# Patient Record
Sex: Male | Born: 1990
Health system: Southern US, Community
[De-identification: ages and names within clinical notes are randomized; demographics above are authoritative.]

## PROBLEM LIST (undated history)

## (undated) DIAGNOSIS — I10 Essential (primary) hypertension: Secondary | ICD-10-CM

## (undated) DIAGNOSIS — D649 Anemia, unspecified: Secondary | ICD-10-CM

## (undated) DIAGNOSIS — J351 Hypertrophy of tonsils: Secondary | ICD-10-CM

## (undated) HISTORY — PX: APPENDECTOMY: SHX54

## (undated) HISTORY — PX: LEG SURGERY: SHX1003

## (undated) HISTORY — DX: Anemia, unspecified: D64.9

## (undated) HISTORY — DX: Hypertrophy of tonsils: J35.1

---
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
## 2009-02-16 ENCOUNTER — Ambulatory Visit: Payer: Self-pay | Admitting: Family Medicine

## 2009-02-16 DIAGNOSIS — I10 Essential (primary) hypertension: Secondary | ICD-10-CM | POA: Insufficient documentation

## 2009-02-16 DIAGNOSIS — R599 Enlarged lymph nodes, unspecified: Secondary | ICD-10-CM | POA: Insufficient documentation

## 2009-05-24 ENCOUNTER — Emergency Department (HOSPITAL_COMMUNITY): Admission: EM | Admit: 2009-05-24 | Discharge: 2009-05-24 | Payer: Self-pay | Admitting: Emergency Medicine

## 2009-07-20 ENCOUNTER — Emergency Department (HOSPITAL_COMMUNITY): Admission: EM | Admit: 2009-07-20 | Discharge: 2009-07-20 | Payer: Self-pay | Admitting: Emergency Medicine

## 2010-04-10 ENCOUNTER — Emergency Department (HOSPITAL_COMMUNITY): Admission: EM | Admit: 2010-04-10 | Discharge: 2010-04-10 | Payer: Self-pay | Admitting: Emergency Medicine

## 2010-07-22 NOTE — Progress Notes (Signed)
Summary: [place] request  Phone Note Call from Patient Call back at [phone]   Caller: Mom, Zella Ball Summary of Call: Mom states son was seen 2 weeks ago for a rash and was given steriods for it.  Rash got better and went away, but now rash is back and it is leaking fluid.  Mom wants to know if he should be seen or if we should call in steriods again. Initial call taken by: Haydee Salter,  February 13, 2007 2:17 PM  Follow-up for Phone Call        line busy x2 line busy x 2 .....................................................................Golden Circle RN  February 13, 2007 2:43 PM  Follow-up by: Golden Circle RN,  February 13, 2007 2:24 PM  Additional Follow-up for Phone Call Additional follow up Details #1::        appt scheduled with pcp in am. mother states the rash is now cracked & bleeding Additional Follow-up by: Golden Circle RN,  February 13, 2007 3:07 PM

## 2010-07-22 NOTE — Miscellaneous (Signed)
Summary: flu shot  Clinical Lists Changes  Problems: Added new problem of VACCINE AGAINST INFLUENZA (ICD-V04.81) Orders: Added new Service order of Flu Vaccine 43yrs + [phone]) - Signed Added new Service order of Admin 1st Vaccine (78295) - Signed Added new Test order of Est Level 1- [facility] [phone]) - Signed Observations: Added new observation of FLU VAX#1VIS: 12/17/04 version given April 06, 2007. (04/06/2007 9:22) Added new observation of FLU VAXLOT: [id number] (04/06/2007 9:22) Added new observation of FLU VAX EXP: 12/18/2007 (04/06/2007 9:22) Added new observation of FLU VAXBY: SALLY CALDWELL RN (04/06/2007 9:22) Added new observation of FLU VAXRTE: IM (04/06/2007 9:22) Added new observation of FLU VAX DSE: 0.5 ml (04/06/2007 9:22) Added new observation of FLU VAXMFR: Sanofi Pasteur (04/06/2007 9:22) Added new observation of FLU VAX SITE: left deltoid (04/06/2007 9:22) Added new observation of FLU VAX: Fluvax 3+ (04/06/2007 9:22) Added new observation of TEMPERATURE: 98.5 deg F (04/06/2007 9:22)       Vital Signs:  Patient Profile:   20 Years Old Male Temp:     98.5 degrees F                Influenza Vaccine    Vaccine Type: Fluvax 3+    Site: left deltoid    Mfr: Sanofi Pasteur    Dose: 0.5 ml    Route: IM    Given by: Golden Circle RN    Exp. Date: 12/18/2007    Lot #: [id number]    VIS given: 12/17/04 version given April 06, 2007.  Flu Vaccine Consent Questions    Do you have a history of severe allergic reactions to this vaccine? no    Any prior history of allergic reactions to egg and/or gelatin? no    Do you have a sensitivity to the preservative Thimersol? no    Do you have a past history of Guillan-Barre Syndrome? no    Do you currently have an acute febrile illness? no    Have you ever had a severe reaction to latex? no    Vaccine information given and explained to patient? yes

## 2010-07-22 NOTE — Progress Notes (Signed)
  Phone Note Outgoing Call   Call placed by: AMY MARTIN RN,  April 25, 2007 10:47 AM Call placed to: Pt's mom Summary of Call: Spoke with pt's mom robin - advised her per Dr. Ludwig Clarks to have pt f/u in 2-3 days if his wrist pain was not improved.  Advised that x-rays of hand and wrist came back normal. Initial call taken by: AMY MARTIN RN,  April 25, 2007 10:48 AM

## 2010-07-22 NOTE — Progress Notes (Signed)
Summary: [place] request  Phone Note Call from Patient   Summary of Call: mom wants pt to be seen this morning at 10:30am for a swollen shut eye - has bothered him all weekend and is very red Initial call taken by: Haydee Salter,  January 29, 2007 9:02 AM

## 2010-07-22 NOTE — Progress Notes (Signed)
Summary: triage  Phone Note Call from Patient Call back at Home Phone [phone]   Reason for Call: Talk to Nurse Summary of Call: pts mom sts she thinks pt slept on his wrist wrong but pt has fever in it, he can't turn his wrist and it hurts to move his pinky, she wants to know what she should do? Initial call taken by: ERIN LEVAN,  April 24, 2007 10:14 AM  Follow-up for Phone Call        to bring him in at 11am Follow-up by: Golden Circle RN,  April 24, 2007 10:22 AM

## 2010-07-22 NOTE — Assessment & Plan Note (Signed)
Summary: poison oak   Vital Signs:  Patient Profile:   20 Years Old Male Weight:      294 pounds Temp:     98.4 degrees F Pulse rate:   79 / minute BP sitting:   128 / 70  Pt. in pain?   no  Vitals Entered By: Golden Circle RN (November 20, 2007 3:00 PM)              Is Patient Diabetic? No     PCP:  MAKEECHA Corliss Marcus MD  Chief Complaint:  poison ivy or oak on r forearm.  History of Present Illness: Pt reports that he was outside on Saturday playing with a friend. +exposure to woods. Thinks he might have come in contact with poison oak. Reports new itchy lesion on right forearm with subsequent eruption of similar area on upper abdomen. Intensely pruritic. States that he has been very sensitive to this in the past and typically gets a reaction "about once a year." Typically has required oral steriods in the past. States he is allergic to calomine lotion. No fevers or chills. Also states he has eczema and psoriasis.       Physical Exam  General:      no acute distress. Obese. Pleasan Skin:      1 x 1 cm area of papular, honey-colored lesion on right forearm. No surrounding erythema. Another 0.5 x 0.5 patch on upper abdomen. No other lesions on body. No other rash. No discharge or drainage.     Impression & Recommendations:  Problem # 1:  SKIN RASH (ICD-782.1) Assessment: Deteriorated Likely contact dermatitis given past history of poison oak. Will treat with 10 days prednisone 20 mg. Advised hydrocortisone cream to area to help with itching. Benadryl as needed. Return if not better after couse of steroids. Does not look eczematous or psoriatic given appearance and distribution. His updated medication list for this problem includes:    Zyrtec Allergy 10 Mg Tabs (Cetirizine hcl) .Marland Kitchen... 1 tab by mouth daily    Sterapred Ds 12 Day 10 Mg Tabs (Prednisone) .Marland Kitchen... Take as directed    Kenalog 0.1 % Crea (Triamcinolone acetonide) .Marland Kitchen... Apply two times a day  to affected  area    Prednisone 20 Mg Tabs (Prednisone) ..... One by mouth daily for 10 days  Orders: [facility]- Est Level  3 ([address])   Medications Added to Medication List This Visit: 1)  Prednisone 20 Mg Tabs (Prednisone) .... One by mouth daily for 10 days   Patient Instructions: 1)  Take prednisone 20 mg once a day for 10 days. 2)  Use hydrocortisone cream to cover the areas of rash. 3)  Use benadryl over the counter per bottle instructions for itching. Might make you sleepy.   Prescriptions: PREDNISONE 20 MG  TABS (PREDNISONE) one by mouth daily for 10 days  #10 x 0   Entered and Authorized by:   Myrtie Soman  MD   Signed by:   Myrtie Soman  MD on 11/20/2007   Method used:   Electronically sent to ...       [facility] [address] [phone]*       [address]       [place], [place]  [address]       Ph: [phone]       Fax: [phone]   RxID:   [id number]  ]

## 2010-07-22 NOTE — Progress Notes (Signed)
Summary: Triage  Phone Note Call from Patient Call back at Home Phone [phone]   Caller: David [name] Summary of Call: Mom wants child to be seen this afternoon with 2 brothers for poison oak. Initial call taken by: Haydee Salter,  November 20, 2007 10:48 AM  Follow-up for Phone Call        appt made for 1:30pm today. 2 sibs with same issue already have appts Follow-up by: Golden Circle RN,  November 20, 2007 10:54 AM

## 2010-07-22 NOTE — Assessment & Plan Note (Signed)
Summary: POISON IVY/EO   Vital Signs:  Patient profile:   20 year old male Height:      70 inches Weight:      229.4 pounds BMI:     33.03 Temp:     98.1 degrees F oral Pulse rate:   80 / minute BP sitting:   133 / 78  (left arm) Cuff size:   large  Vitals Entered By: Dedra Skeens CMA, (Oct 21, 2008 8:39 AM) CC: poison ivy Is Patient Diabetic? No Pain Assessment Patient in pain? no        History of Present Illness: 20 yo WM who presents for Acute Visit due to poison ivy.  Was outside in woods last Wed or Thurs, then noticed lesions starting last Friday.  Has had muliple times before and required oral steroids because it usually involves the face.  This time the face is spared.  Predominantly involves bilateral arms and less so on stomach / thighs.  No fever.    PMHx, SHx, FHx, Medications, Allergies reviewed for relevance.  See below for changes.   Habits & Providers     Tobacco Status: never  Allergies: 1)  ! Augmentin Es-600 (Amoxicillin-Pot Clavulanate)  Review of Systems      See HPI  Physical Exam  General:  Overweight-appearing, no acute distress.  Alert and oriented x 3.  Skin:  Multiple erythematous vesicles in linear distribution predominantly on bilateral arms.  Satellite lesions on lower stomach and thighs.  No involvemen of face.   Impression & Recommendations:  Problem # 1:  POISON IVY DERMATITIS (ICD-692.6) Assessment New Will give Lidex ointment for topical use to help with itching.  No need for oral steroids given lack of facial involvement.  Will also use Zyrtec at night to help with itching.  Recommended other over the counter preparations such as oatmeal baths and benzocaine spray as needed for relief as well.  Gave red flags for which to call or return.  His updated medication list for this problem includes:    Zyrtec Allergy 10 Mg Tabs (Cetirizine hcl) .Marland Kitchen... 1 tab by mouth daily    Sterapred Ds 12 Day 10 Mg Tabs (Prednisone) .Marland Kitchen... Take as  directed    Kenalog 0.1 % Crea (Triamcinolone acetonide) .Marland Kitchen... Apply two times a day  to affected area    Prednisone 20 Mg Tabs (Prednisone) ..... One by mouth daily for 10 days    Lidex 0.05 % Crea (Fluocinonide) .Marland Kitchen... Apply two times a day to affected areas as needed for up to two weeks - do not use on face or groin - disp 60 gram tube    Zyrtec Allergy 10 Mg Tabs (Cetirizine hcl) .Marland Kitchen... 1 tablet by mouth at bedtime as needed for itching Orders: [facility]- Est Level  3 ([address]109)  Complete Medication List: 1)  Zyrtec Allergy 10 Mg Tabs (Cetirizine hcl) .Marland Kitchen.. 1 tab by mouth daily 2)  Sterapred Ds 12 Day 10 Mg Tabs (Prednisone) .... Take as directed 3)  Kenalog 0.1 % Crea (Triamcinolone acetonide) .... Apply two times a day  to affected area 4)  Prednisone 20 Mg Tabs (Prednisone) .... One by mouth daily for 10 days 5)  Bactrim Ds 800-160 Mg Tabs (Sulfamethoxazole-trimethoprim) .Marland Kitchen.. 1 tab by mouth by mouth two times a day x 14 days 6)  Lidex 0.05 % Crea (Fluocinonide) .... Apply two times a day to affected areas as needed for up to two weeks - do not use on face or groin -  disp 60 gram tube 7)  Zyrtec Allergy 10 Mg Tabs (Cetirizine hcl) .Marland Kitchen.. 1 tablet by mouth at bedtime as needed for itching  Patient Instructions: 1)  Please schedule a follow-up appointment as needed.  2)  If the poison ivy spreads to your face, please call the office. 3)  Use the Lidex (Fluocinonide) cream two times a day except on face or groin area. 4)  Use over the counter Hydrocortisone on face and groin areas. 5)  You can take Zyrtec at night as needed for itching. Prescriptions: ZYRTEC ALLERGY 10 MG TABS (CETIRIZINE HCL) 1 tablet by mouth at bedtime as needed for itching  #30 x 0   Entered and Authorized by:   Drue Dun MD   Signed by:   Drue Dun MD on 10/21/2008   Method used:   Electronically to        [facility]. [phone]* (retail)       [address]       [place], [place]   [address]       Ph: [phone]       Fax: [phone]   RxID:   [id number] LIDEX 0.05 % CREA (FLUOCINONIDE) Apply two times a day to affected areas as needed for up to two weeks - do not use on face or groin - disp 60 gram tube  #1 x 0   Entered and Authorized by:   Drue Dun MD   Signed by:   Drue Dun MD on 10/21/2008   Method used:   Electronically to        [facility]. [phone]* (retail)       [address]       [place], [place]  [address]       Ph: [phone]       Fax: [phone]   RxID:   [id number]

## 2010-07-22 NOTE — Assessment & Plan Note (Signed)
Summary: LEFT WRIST PAIN   Vital Signs:  Patient Profile:   20 Years Old Male Weight:      293 pounds Pulse rate:   81 / minute BP sitting:   161 / 85  Vitals Entered By: Lillia Pauls CMA (April 24, 2007 11:20 AM)                 PCP:  Glorianne Manchester MD  Chief Complaint:  L WRIST PAIN X  1 D.  History of Present Illness: 20 yo WM c/o of acute onset L wrist  and L  4th/ 5th finger pain. Denies injury, insect bite, weight lift, fever, chills. Wrist flexion/ extension is decreased 2 to pain, as well as 4th and 5th finger flexion / extension and L wrist supination. Mild edema of L wrist / hand. NO sensory / motor deficits. No L elbow, shoulder/neck pain.  Denies previous L wrist / hand injury       Physical Exam  General:      good color, well hydrated, and obese.   Lungs:      Clear to ausc, no crackles, rhonchi or wheezing, no grunting, flaring or retractions  Heart:      RRR without murmur  Pulses:      2 + peripheral pulses Extremities:      L Wrist flexion/ extension is decreased 2 to pain, as well as 4th and 5th finger flexion / extension and L wrist supination. Mild edema of L wrist / hand. NO sensory / motor deficits. Pulses  are intact.  Neurologic:      Neurologic exam grossly intact  Skin:      intact without lesions, rashes  Psychiatric:      alert and cooperative      Impression & Recommendations:  Problem # 1:  WRIST PAIN, LEFT (ICD-719.43) At the end of the OV pt admitted to fall of the bed overnight. Complete L wrist/ hand x-ray pending to r/o fx.  Ibuprofen 800 mg three times a day, ice three times a day, pt is to wear a sling on the L forearm. F/u in 2 days.   AddenduM: X-ray negative for fx. Consider repeat x-ray in 2 wks if no improvement to r/o carpal bones fx that sometimes are not detected until after 2 wks of an injury.  Orders: Radiology other (Radiology Other) [facility]- Est Level  3 ([address])   Medications Added to Medication  List This Visit: 1)  Ibuprofen 800 Mg Tabs (Ibuprofen) .Marland Kitchen.. 1 tab by mouth q 8 hours x 2-3 wks     Prescriptions: IBUPROFEN 800 MG  TABS (IBUPROFEN) 1 tab by mouth q 8 hours x 2-3 wks  #65 x 1   Entered and Authorized by:   Jackalyn Lombard MD   Signed by:   Jackalyn Lombard MD on 04/24/2007   Method used:   Electronically sent to ...       [facility] [address] [phone]*       [address]       [place], [place]  [address]       Ph: [phone]       Fax: [phone]   RxID:   [id number]  ]

## 2010-07-22 NOTE — Assessment & Plan Note (Signed)
Summary: stepped on nail/dlh   Vital Signs:  Patient profile:   20 year old male Height:      70 inches Weight:      308 pounds BMI:     44.35 Temp:     98.2 degrees F oral Pulse rate:   84 / minute BP sitting:   142 / 72  (left arm) Cuff size:   large  Vitals Entered By: Garen Grams LPN (September 15, 2008 10:38 AM) CC: stepped on nail Pain Assessment Patient in pain? 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  He is unsure if his tetanus is up to date.  Nail punctured the bottom of his left great toe.  Denies any fevers, chills, or drainage from the site.  It is warm, very tender to touch with mild erythema.  Medical history reviewed.  Allergies reviewed and updated.    Habits & Providers     Tobacco Status: never  Current Problems (verified): 1)  Need Prophylactic Vaccination&inoculation Flu  (ICD-V04.81) 2)  Wrist Pain, Left  (ICD-719.43) 3)  Ingrown Toenail  (ICD-703.0) 4)  Vaccine Against Influenza  (ICD-V04.81) 5)  Psoriasis  (ICD-696.1) 6)  Skin Rash  (ICD-782.1) 7)  Redness/discharge, Eye  (ICD-379.93) 8)  Obesity, Nos  (ICD-278.00) 9)  Eczema, Atopic Dermatitis  (ICD-691.8) 10)  Back Pain, Low  (ICD-724.2) 11)  Asthma, Exercise Induced  (ICD-493.81)  Review of Systems      See HPI  Physical Exam  General:  no acute distress. Obese. Pleasant. Extremities:  Left big toe, mildly erthematous and swollen around puncture site, no drainage, good pulses.   Impression & Recommendations:  Problem # 1:  WOUND, OPEN, TOE(S), WITHOUT COMPLICATION (ICD-893.0) Assessment New  Area of cellulitis around puncture wound. Tetanus shot given today as not up to date.  Will treat with Bactrim DS for 14 days.  Red flags given for RTC.  Orders: [facility]- Est Level  3 ([address])  Complete Medication List: 1)  Zyrtec Allergy 10 Mg Tabs  (Cetirizine hcl) .Marland Kitchen.. 1 tab by mouth daily 2)  Sterapred Ds 12 Day 10 Mg Tabs (Prednisone) .... Take as directed 3)  Kenalog 0.1 % Crea (Triamcinolone acetonide) .... Apply two times a day  to affected area 4)  Prednisone 20 Mg Tabs (Prednisone) .... One by mouth daily for 10 days 5)  Bactrim Ds 800-160 Mg Tabs (Sulfamethoxazole-trimethoprim) .Marland Kitchen.. 1 tab by mouth by mouth two times a day x 14 days  Other Orders: State-TD Vaccine 7 yrs. & > IM (27253G) Admin 1st Vaccine (64[address])  Patient Instructions: 1)  Good to see you Jakoby. 2)  Say hello to your mother for me. 3)  Have a great Easter! 4)  Try to keep the area of your foot clean.  If you develop any fevers, chill, or spreading of the redness on your toe, please call us or go to urgent care. Prescriptions: BACTRIM DS 800-160 MG TABS (SULFAMETHOXAZOLE-TRIMETHOPRIM) 1 tab by mouth by mouth two times a day x 14 days  #28 x 0   Entered and Authorized by:   Ruthe Mannan MD   Signed by:   Ruthe Mannan MD on 09/15/2008   Method used:   Electronically to        [facility]. [phone]* (retail)  [address]       [place], [place]  [address]       Ph: [phone]       Fax: [phone]   RxID:   [id number]    Immunizations Administered:  Tetanus Vaccine:    Vaccine Type: Tdap (State)    Site: left deltoid    Mfr: GlaxoSmithKline    Dose: 0.5 ml    Route: IM    Given by: Jone Baseman CMA    Exp. Date: 06/05/2010    Lot #: [id number]    VIS given: 05/08/07 version given September 15, 2008.
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
## 2010-07-22 NOTE — Assessment & Plan Note (Signed)
Summary: rash is now bleeding   Vital Signs:  Patient Profile:   20 Years Old Male Weight:      282 pounds (128.18 kg) Temp:     97.5 degrees F (36.39 degrees C) Pulse rate:   69 / minute BP sitting:   115 / 70  (right arm)  Pt. in pain?   no  Vitals Entered By: Tomasa Rand (February 14, 2007 8:30 AM)                PCP:  Alanson Puls CRAWFORD MD  Chief Complaint:  Pt c/o rash to left arm shoulder and left leg for two weeks.  History of Present Illness: 20 yo presents for rash on left elbow and knee of atleast 2 weeks duration.  States once treated for poison ivy and rash worsened.  Then returned and treated with oral steroids which greatly imprved area.  ? Sister with eczema v/s psoriasis type lesions in past.  Braylon with no known hx of skin problems in the past.        Risk Factors:  Tobacco use:  never   Physical Exam  Skin:      plaques with scale on left elbow and knee measuring atleast 6 x 4 cm.  Area of excoriation above on shoulder with new plaque formation     Impression & Recommendations:  Problem # 1:  PSORIASIS (ICD-696.1) Assessment: Deteriorated See detailed pt instructions.  Hand out given on psoriasis.   Orders: [facility]- Est Level  3 (78295)   Medications Added to Medication List This Visit: 1)  Sterapred Ds 12 Day 10 Mg Tabs (Prednisone) .... Take as directed 2)  Kenalog 0.1 % Crea (Triamcinolone acetonide) .... Apply two times a day  to affected area   Patient Instructions: 1)  Start prednisone taper ( sterapred DS) 60 mg to 20 mg by mouth x 12 days 2)  Start traimcinolone 0.1 % cream to affected area two times a day 3)  Keep lesions covered while working. 4)  Return sooner for fever temp > 100.4 increased drainage etc. 5)  Wear gloves while working as well.      Prescriptions: KENALOG 0.1 %  CREA (TRIAMCINOLONE ACETONIDE) Apply two times a day  to affected area  #60 gram tube x 2   Entered and Authorized by:   Glorianne Manchester MD   Signed by:   Glorianne Manchester MD on 02/14/2007   Method used:   Print then Give to Patient   RxID:   [id number] DS 12 DAY 10 MG  TABS (PREDNISONE) take as directed  #1 pack x 0   Entered and Authorized by:   Glorianne Manchester MD   Signed by:   Glorianne Manchester MD on 02/14/2007   Method used:   Print then Give to Patient   RxID:   [id number]
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
## 2010-07-22 NOTE — Progress Notes (Signed)
  Phone Note Call from Patient   Summary of Call: fell in woods last wed.  thinks he got poison ivy on arms, groin, right leg when in the woods, but didn't notice sxs until Fri.  has red bumps that are "oozing."  advised to call [facility] in the am for SDA.  Told he could take otc benedryl and ibuprofen for pain and itching unti the morning. Initial call taken by: Asher Muir MD,  Oct 20, 2008 8:11 PM

## 2010-07-22 NOTE — Assessment & Plan Note (Signed)
Summary: FLU SHOT  Nurse Visit    Prior Medications: ZYRTEC ALLERGY 10 MG  TABS (CETIRIZINE HCL) 1 tab by mouth daily STERAPRED DS 12 DAY 10 MG  TABS (PREDNISONE) take as directed KENALOG 0.1 %  CREA (TRIAMCINOLONE ACETONIDE) Apply two times a day  to affected area PREDNISONE 20 MG  TABS (PREDNISONE) one by mouth daily for 10 days    Influenza Vaccine    Vaccine Type: Fluvax Non-MCR    Site: left deltoid    Mfr: GlaxoSmithKline    Dose: 0.5 ml    Route: IM    Given by: Heywood Footman RN    Exp. Date: 12/17/2008    Lot #: [id number]    VIS given: 01/11/07 version given March 21, 2008.  Flu Vaccine Consent Questions    Do you have a history of severe allergic reactions to this vaccine? no    Any prior history of allergic reactions to egg and/or gelatin? no    Do you have a sensitivity to the preservative Thimersol? no    Do you have a past history of Guillan-Barre Syndrome? no    Do you currently have an acute febrile illness? no    Have you ever had a severe reaction to latex? no    Vaccine information given and explained to patient? yes   Orders Added: 1)  Influenza Vaccine NON MCR [00028] 2)  Est Level 1- [place] [09811]    ]
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
## 2010-08-05 ENCOUNTER — Emergency Department (HOSPITAL_COMMUNITY)
Admission: EM | Admit: 2010-08-05 | Discharge: 2010-08-05 | Disposition: A | Discharge status: Home / Self Care | Payer: Self-pay | Attending: Emergency Medicine | Admitting: Emergency Medicine

## 2010-08-05 ENCOUNTER — Encounter (HOSPITAL_COMMUNITY): Payer: Self-pay | Admitting: Radiology

## 2010-08-05 ENCOUNTER — Emergency Department (HOSPITAL_COMMUNITY): Payer: Self-pay

## 2010-08-05 DIAGNOSIS — R05 Cough: Secondary | ICD-10-CM | POA: Insufficient documentation

## 2010-08-05 DIAGNOSIS — R059 Cough, unspecified: Secondary | ICD-10-CM | POA: Insufficient documentation

## 2010-08-05 DIAGNOSIS — R071 Chest pain on breathing: Secondary | ICD-10-CM | POA: Insufficient documentation

## 2010-08-05 DIAGNOSIS — R079 Chest pain, unspecified: Secondary | ICD-10-CM | POA: Insufficient documentation

## 2010-08-05 DIAGNOSIS — J209 Acute bronchitis, unspecified: Secondary | ICD-10-CM | POA: Insufficient documentation

## 2010-08-08 ENCOUNTER — Emergency Department (HOSPITAL_COMMUNITY): Payer: Self-pay

## 2010-08-08 ENCOUNTER — Emergency Department (HOSPITAL_COMMUNITY)
Admission: EM | Admit: 2010-08-08 | Discharge: 2010-08-08 | Disposition: A | Discharge status: Home / Self Care | Payer: Self-pay | Attending: Emergency Medicine | Admitting: Emergency Medicine

## 2010-08-08 DIAGNOSIS — R0602 Shortness of breath: Secondary | ICD-10-CM

## 2010-08-08 DIAGNOSIS — R0609 Other forms of dyspnea: Secondary | ICD-10-CM | POA: Insufficient documentation

## 2010-08-08 DIAGNOSIS — R059 Cough, unspecified: Secondary | ICD-10-CM | POA: Insufficient documentation

## 2010-08-08 DIAGNOSIS — J209 Acute bronchitis, unspecified: Secondary | ICD-10-CM | POA: Insufficient documentation

## 2010-08-08 DIAGNOSIS — R05 Cough: Secondary | ICD-10-CM | POA: Insufficient documentation

## 2010-08-08 DIAGNOSIS — R0989 Other specified symptoms and signs involving the circulatory and respiratory systems: Secondary | ICD-10-CM | POA: Insufficient documentation

## 2010-09-01 LAB — RAPID STREP SCREEN (MED CTR MEBANE ONLY): Streptococcus, Group A Screen (Direct): NEGATIVE

## 2010-09-06 LAB — RAPID STREP SCREEN (MED CTR MEBANE ONLY): Streptococcus, Group A Screen (Direct): NEGATIVE

## 2010-09-28 LAB — POCT I-STAT, CHEM 8
BUN: 15 mg/dL (ref 6–23)
Calcium, Ion: 1.12 mmol/L (ref 1.12–1.32)
Chloride: 103 mEq/L (ref 96–112)
Creatinine, Ser: 1.2 mg/dL (ref 0.4–1.5)
Glucose, Bld: 95 mg/dL (ref 70–99)
HCT: 39 % (ref 39.0–52.0)
Hemoglobin: 13.3 g/dL (ref 13.0–17.0)
Potassium: 3.9 mEq/L (ref 3.5–5.1)
Sodium: 139 mEq/L (ref 135–145)
TCO2: 26 mmol/L (ref 0–100)

## 2010-09-28 LAB — ETHANOL: Alcohol, Ethyl (B): <5 mg/dL (ref 0–10)

## 2010-09-28 LAB — OSMOLALITY: Osmolality: 287 mOsm/kg (ref 275–300)

## 2010-10-27 ENCOUNTER — Emergency Department (HOSPITAL_COMMUNITY): Payer: Self-pay

## 2010-10-27 ENCOUNTER — Emergency Department (HOSPITAL_COMMUNITY)
Admission: EM | Admit: 2010-10-27 | Discharge: 2010-10-27 | Disposition: A | Discharge status: Home / Self Care | Payer: Self-pay | Attending: Emergency Medicine | Admitting: Emergency Medicine

## 2010-10-27 DIAGNOSIS — J069 Acute upper respiratory infection, unspecified: Secondary | ICD-10-CM | POA: Insufficient documentation

## 2010-10-27 DIAGNOSIS — B9789 Other viral agents as the cause of diseases classified elsewhere: Secondary | ICD-10-CM | POA: Insufficient documentation

## 2010-10-27 LAB — RAPID STREP SCREEN (MED CTR MEBANE ONLY): Streptococcus, Group A Screen (Direct): NEGATIVE
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
## 2010-12-12 ENCOUNTER — Emergency Department (HOSPITAL_COMMUNITY)
Admission: EM | Admit: 2010-12-12 | Discharge: 2010-12-13 | Disposition: A | Discharge status: Home / Self Care | Payer: Self-pay | Attending: Emergency Medicine | Admitting: Emergency Medicine

## 2010-12-12 ENCOUNTER — Emergency Department (HOSPITAL_COMMUNITY): Payer: Self-pay

## 2010-12-12 DIAGNOSIS — S63509A Unspecified sprain of unspecified wrist, initial encounter: Secondary | ICD-10-CM | POA: Insufficient documentation

## 2010-12-12 DIAGNOSIS — W010XXA Fall on same level from slipping, tripping and stumbling without subsequent striking against object, initial encounter: Secondary | ICD-10-CM | POA: Insufficient documentation

## 2011-05-17 ENCOUNTER — Other Ambulatory Visit: Payer: Self-pay

## 2011-05-17 ENCOUNTER — Emergency Department (HOSPITAL_COMMUNITY): Payer: Self-pay

## 2011-05-17 ENCOUNTER — Encounter (HOSPITAL_COMMUNITY): Payer: Self-pay | Admitting: Emergency Medicine

## 2011-05-17 ENCOUNTER — Emergency Department (HOSPITAL_COMMUNITY)
Admission: EM | Admit: 2011-05-17 | Discharge: 2011-05-17 | Disposition: A | Discharge status: Home / Self Care | Payer: Self-pay | Attending: Emergency Medicine | Admitting: Emergency Medicine

## 2011-05-17 DIAGNOSIS — J45909 Unspecified asthma, uncomplicated: Secondary | ICD-10-CM | POA: Insufficient documentation

## 2011-05-17 DIAGNOSIS — R0602 Shortness of breath: Secondary | ICD-10-CM | POA: Insufficient documentation

## 2011-05-17 MED ORDER — ALBUTEROL SULFATE HFA 108 (90 BASE) MCG/ACT IN AERS: 1.0000 | INHALATION_SPRAY | Freq: Four times a day (QID) | RESPIRATORY_TRACT | Status: DC | PRN

## 2011-05-17 NOTE — ED Provider Notes (Addendum)
History   This chart was scribed for [employer]. Colon Branch, MD by Clarita Crane. The patient was seen in room APA12/APA12 and the patient's care was started at 7:20pm CSN: [id number] Arrival date & time: 05/17/2011  7:07 PM   First MD Initiated Contact with Patient 05/17/11 1909      Chief Complaint  Patient presents with  . Shortness of Breath  . Chest Pain    (Consider location/radiation/quality/duration/timing/severity/associated sxs/prior treatment) HPI  David [name] is a 20 y.o. male who presents to the Emergency Department complaining of waxing and waning shortness of breath 5 1/2 hours ago today. The onset was sudden. Pt denies associated fever, chills, cough, nausea, vomiting, diarrhea, urinary symptoms, wheezing and dizziness. Pt admits to a history of asthma and has used his inhaler today for the first time in 4 years. Pt states his shortness of breath is aggravated with movement and resolved at rest.   Past Medical History  Diagnosis Date  . Asthma     Past Surgical History  Procedure Date  . Leg surgery     right    Family History  Problem Relation Age of Onset  . Cancer Mother   . COPD Mother     History  Substance Use Topics  . Smoking status: Never Smoker   . Smokeless tobacco: Never Used  . Alcohol Use: No      Review of Systems  Constitutional: Negative for fever and chills.  Respiratory: Positive for shortness of breath. Negative for cough and wheezing.        Shortness of breath: DOE and resolved at rest.  Gastrointestinal: Negative for nausea, vomiting and diarrhea.  Neurological: Negative for dizziness.  All other systems reviewed and are negative.    Allergies  Augmentin and NWG:NFAOZHYQMVH+QIONGEXBM+WUXLKGMWNU acid+aspartame  Home Medications  No current outpatient prescriptions on file.  BP 139/74  Pulse 77  Temp(Src) 98.1 F (36.7 C) (Oral)  Resp 16  Ht 5\' 10"  (1.778 m)  Wt 301 lb (136.533 kg)  BMI 43.19 kg/m2  SpO2  100%  Physical Exam  Nursing note and vitals reviewed. Constitutional: He is oriented to person, place, and time. He appears well-developed and well-nourished.  HENT:  Head: Normocephalic and atraumatic.  Eyes: Conjunctivae and EOM are normal. Pupils are equal, round, and reactive to light.  Neck: Normal range of motion. Neck supple.  Cardiovascular: Normal rate and regular rhythm.  Exam reveals no gallop and no friction rub.   No murmur heard. Pulmonary/Chest: Effort normal and breath sounds normal. No respiratory distress. He has no wheezes. He has no rales.  Abdominal: Soft. Bowel sounds are normal. There is no tenderness.  Musculoskeletal: Normal range of motion.  Neurological: He is alert and oriented to person, place, and time.  Skin: Skin is warm and dry.  Psychiatric: He has a normal mood and affect. His behavior is normal.    ED Course  Procedures (including critical care time) DIAGNOSTIC STUDIES: Oxygen Saturation is 100% on room air, normal by my interpretation.   Results for orders placed during the hospital encounter of 10/27/10  RAPID STREP SCREEN      Component Value Range   Streptococcus, Group A Screen (Direct) NEGATIVE  NEGATIVE   Dg Chest 2 View  05/17/2011  *RADIOLOGY REPORT*  Clinical Data:  Chest pain and shortness of breath.  CHEST - 2 VIEW  Comparison: 08/08/2010  Findings: The heart size and mediastinal contours are within normal limits.  Both lungs are clear.  The  visualized skeletal structures are unremarkable.  IMPRESSION: No active disease.  Original Report Authenticated By: Reola Calkins, M.D.   COORDINATION OF CARE:  Date: 05/17/2011 1892  Rate: 70  Rhythm: normal sinus rhythm  QRS Axis: normal  Intervals: PR shortened  ST/T Wave abnormalities: normal  Conduction Disutrbances:none  Narrative Interpretation:   Old EKG Reviewed: none available       MDM  Patient with shortness of breath that began today with exertion. Remote history of  asthma, used an old inhaler today with no relief. O2 sats 100% on room air. Ambulated in the hallway with O2 sats remaining 98-100% with no subjective shortness of breath.Chest xray unremarkable. Pt stable in ED with no significant deterioration in condition.The patient appears reasonably screened and/or stabilized for discharge and I doubt any other medical condition or other [facility] requiring further screening, evaluation, or treatment in the ED at this time prior to discharge.  I personally performed the services described in this documentation, which was scribed in my presence. The recorded information has been reviewed and considered.        Nicoletta Dress. Colon Branch, MD 05/17/11 2051  Nicoletta Dress. Colon Branch, MD 05/17/11 2250

## 2011-05-17 NOTE — ED Notes (Signed)
Patient c/o shortness of breath that started this afternoon. Denies any cough. Per patient used to have asthma, used inhaler with no relief.

## 2011-12-13 ENCOUNTER — Emergency Department (HOSPITAL_COMMUNITY)
Admission: EM | Admit: 2011-12-13 | Discharge: 2011-12-13 | Disposition: A | Discharge status: Home / Self Care | Payer: Self-pay | Attending: Emergency Medicine | Admitting: Emergency Medicine

## 2011-12-13 ENCOUNTER — Encounter (HOSPITAL_COMMUNITY): Payer: Self-pay | Admitting: *Deleted

## 2011-12-13 DIAGNOSIS — R21 Rash and other nonspecific skin eruption: Secondary | ICD-10-CM | POA: Insufficient documentation

## 2011-12-13 DIAGNOSIS — L309 Dermatitis, unspecified: Secondary | ICD-10-CM

## 2011-12-13 DIAGNOSIS — J45909 Unspecified asthma, uncomplicated: Secondary | ICD-10-CM | POA: Insufficient documentation

## 2011-12-13 MED ORDER — PREDNISONE 10 MG PO TABS: ORAL_TABLET | ORAL | Status: DC

## 2011-12-13 MED ORDER — METHYLPREDNISOLONE SODIUM SUCC 125 MG IJ SOLR
125.0000 mg | Freq: Once | INTRAMUSCULAR | Status: AC
  Administered 2011-12-13: 125 mg via INTRAMUSCULAR
  Filled 2011-12-13: qty 2

## 2011-12-13 MED ORDER — LORATADINE 10 MG PO TABS
10.0000 mg | ORAL_TABLET | Freq: Once | ORAL | Status: AC
  Administered 2011-12-13: 10 mg via ORAL
  Filled 2011-12-13: qty 1

## 2011-12-13 NOTE — ED Provider Notes (Signed)
History     CSN: [id number]  Arrival date & time 12/13/11  1045   First MD Initiated Contact with Patient 12/13/11 1152      Chief Complaint  Patient presents with  . Rash    (Consider location/radiation/quality/duration/timing/severity/associated sxs/prior treatment) Patient is a 21 y.o. male presenting with rash. The history is provided by the patient.  Rash  This is a new problem. The current episode started 2 days ago. The problem has been gradually worsening. The problem is associated with an unknown factor. There has been no fever. The rash is present on the left arm, right arm and face. The patient is experiencing no pain. Associated symptoms include itching. He has tried nothing for the symptoms. The treatment provided no relief.    Past Medical History  Diagnosis Date  . Asthma     Past Surgical History  Procedure Date  . Leg surgery     right    Family History  Problem Relation Age of Onset  . Cancer Mother   . COPD Mother     History  Substance Use Topics  . Smoking status: Never Smoker   . Smokeless tobacco: Never Used  . Alcohol Use: No      Review of Systems  Constitutional: Negative for activity change.       All ROS Neg except as noted in HPI  HENT: Negative for nosebleeds and neck pain.   Eyes: Negative for photophobia and discharge.  Respiratory: Positive for shortness of breath and wheezing. Negative for cough.   Cardiovascular: Negative for chest pain and palpitations.  Gastrointestinal: Negative for abdominal pain and blood in stool.  Genitourinary: Negative for dysuria, frequency and hematuria.  Musculoskeletal: Negative for back pain and arthralgias.  Skin: Positive for itching and rash.  Neurological: Negative for dizziness, seizures and speech difficulty.  Psychiatric/Behavioral: Negative for hallucinations and confusion.    Allergies  Amoxicillin-pot clavulanate and Passion fruit flavor  Home Medications   Current Outpatient  Rx  Name Route Sig Dispense Refill  . ALBUTEROL SULFATE HFA 108 (90 BASE) MCG/ACT IN AERS Inhalation Inhale 1-2 puffs into the lungs every 6 (six) hours as needed for wheezing. 1 Inhaler 0    BP 130/55  Pulse 68  Temp 97.9 F (36.6 C) (Oral)  Resp 18  Ht 5\' 10"  (1.778 m)  Wt 301 lb (136.533 kg)  BMI 43.19 kg/m2  SpO2 100%  Physical Exam  Nursing note and vitals reviewed. Constitutional: He is oriented to person, place, and time. He appears well-developed and well-nourished.  Non-toxic appearance.  HENT:  Head: Normocephalic.  Right Ear: Tympanic membrane and external ear normal.  Left Ear: Tympanic membrane and external ear normal.  Eyes: EOM and lids are normal. Pupils are equal, round, and reactive to light.  Neck: Normal range of motion. Neck supple. Carotid bruit is not present.  Cardiovascular: Normal rate, regular rhythm, normal heart sounds, intact distal pulses and normal pulses.   Pulmonary/Chest: Breath sounds normal. No respiratory distress.  Abdominal: Soft. Bowel sounds are normal. There is no tenderness. There is no guarding.  Musculoskeletal: Normal range of motion.  Lymphadenopathy:       Head (right side): No submandibular adenopathy present.       Head (left side): No submandibular adenopathy present.    He has no cervical adenopathy.  Neurological: He is alert and oriented to person, place, and time. He has normal strength. No cranial nerve deficit or sensory deficit.  Skin: Skin is warm  and dry. Rash noted.       Red to brown patches with some scaling in the right and left axilla area. Few lesions of the left fact  2 at the forehead  And eye brow on the left.  Psychiatric: He has a normal mood and affect. His speech is normal.    ED Course  Procedures (including critical care time)  Labs Reviewed - No data to display No results found.   No diagnosis found.    MDM  I have reviewed nursing notes, vital signs, and all appropriate lab and imaging  results for this patient. Rash seems to be consistent with eczema. Patient treated with intramuscular Solu-Medrol. Patient will be given prescriptions for prednisone taper, Claritin, and he will use Benadryl at bedtime for itching. Patient advised to see the dermatologist if this is not effective.       Kathie Dike, [place] 12/13/11 1208

## 2011-12-13 NOTE — ED Notes (Signed)
C/o rash to inner aspect of both elbows and itching to both lips for two days, no sob, swelling noted. Pt states that the rash started two days ago

## 2011-12-13 NOTE — Discharge Instructions (Signed)
Eczema PLEASE USE CLARITIN OR ALLEGRA EACH MORNING FOR ITCHING. USE BENADRYL AT BED TIME. PREDNISONE DAILY WITH FOOD UNTIL ALL TAKEN.   PLEASE SEE A DERMATOLOGIST FOR EVALUATION IF NOT IMPROVING.                                                 Atopic dermatitis, or eczema, is an inherited type of sensitive skin. Often people with eczema have a family history of allergies, asthma, or hay fever. It causes a red itchy rash and dry scaly skin. The itchiness may occur before the skin rash and may be very intense. It is not contagious. Eczema is generally worse during the cooler winter months and often improves with the warmth of summer. Eczema usually starts showing signs in infancy. Some children outgrow eczema, but it may last through adulthood. Flare-ups may be caused by:  Eating something or contact with something you are sensitive or allergic to.   Stress.  DIAGNOSIS  The diagnosis of eczema is usually based upon symptoms and medical history. TREATMENT  Eczema cannot be cured, but symptoms usually can be controlled with treatment or avoidance of allergens (things to which you are sensitive or allergic to).  Controlling the itching and scratching.   Use over-the-counter antihistamines as directed for itching. It is especially useful at night when the itching tends to be worse.   Use over-the-counter steroid creams as directed for itching.   Scratching makes the rash and itching worse and may cause impetigo (a skin infection) if fingernails are contaminated (dirty).   Keeping the skin well moisturized with creams every day. This will seal in moisture and help prevent dryness. Lotions containing alcohol and water can dry the skin and are not recommended.   Limiting exposure to allergens.   Recognizing situations that cause stress.   Developing a plan to manage stress.  HOME CARE INSTRUCTIONS   Take prescription and over-the-counter medicines as directed by your caregiver.   Do not use  anything on the skin without checking with your caregiver.   Keep baths or showers short (5 minutes) in warm (not hot) water. Use mild cleansers for bathing. You may add non-perfumed bath oil to the bath water. It is best to avoid soap and bubble bath.   Immediately after a bath or shower, when the skin is still damp, apply a moisturizing ointment to the entire body. This ointment should be a petroleum ointment. This will seal in moisture and help prevent dryness. The thicker the ointment the better. These should be unscented.   Keep fingernails cut short and wash hands often. If your child has eczema, it may be necessary to put soft gloves or mittens on your child at night.   Dress in clothes made of cotton or cotton blends. Dress lightly, as heat increases itching.   Avoid foods that may cause flare-ups. Common foods include cow's milk, peanut butter, eggs and wheat.   Keep a child with eczema away from anyone with fever blisters. The virus that causes fever blisters (herpes simplex) can cause a serious skin infection in children with eczema.  SEEK MEDICAL CARE IF:   Itching interferes with sleep.   The rash gets worse or is not better within one week following treatment.   The rash looks infected (pus or soft yellow scabs).   You or your child has  an oral temperature above 102 F (38.9 C).   Your baby is older than 3 months with a rectal temperature of 100.5 F (38.1 C) or higher for more than 1 day.   The rash flares up after contact with someone who has fever blisters.  SEEK IMMEDIATE MEDICAL CARE IF:   Your baby is older than 3 months with a rectal temperature of 102 F (38.9 C) or higher.   Your baby is older than 3 months or younger with a rectal temperature of 100.4 F (38 C) or higher.  Document Released: 06/03/2000 Document Revised: 05/26/2011 Document Reviewed: 04/08/2009 [facility] Patient Information 2012 [place], [place].

## 2011-12-14 NOTE — ED Provider Notes (Signed)
Medical screening examination/treatment/procedure(s) were performed by non-physician practitioner and as supervising physician I was immediately available for consultation/collaboration.   Shakeitha Umbaugh L Alvie Speltz, MD 12/14/11 0822 

## 2012-08-17 ENCOUNTER — Encounter (HOSPITAL_COMMUNITY): Payer: Self-pay | Admitting: *Deleted

## 2012-08-17 ENCOUNTER — Emergency Department (HOSPITAL_COMMUNITY)
Admission: EM | Admit: 2012-08-17 | Discharge: 2012-08-17 | Disposition: A | Discharge status: Home / Self Care | Payer: BC Managed Care – PPO | Attending: Emergency Medicine | Admitting: Emergency Medicine

## 2012-08-17 DIAGNOSIS — Z9889 Other specified postprocedural states: Secondary | ICD-10-CM | POA: Insufficient documentation

## 2012-08-17 DIAGNOSIS — R059 Cough, unspecified: Secondary | ICD-10-CM | POA: Insufficient documentation

## 2012-08-17 DIAGNOSIS — J45909 Unspecified asthma, uncomplicated: Secondary | ICD-10-CM | POA: Insufficient documentation

## 2012-08-17 DIAGNOSIS — J020 Streptococcal pharyngitis: Secondary | ICD-10-CM | POA: Insufficient documentation

## 2012-08-17 MED ORDER — LEVOFLOXACIN 500 MG PO TABS
500.0000 mg | ORAL_TABLET | Freq: Once | ORAL | Status: AC
  Administered 2012-08-17: 500 mg via ORAL
  Filled 2012-08-17: qty 1

## 2012-08-17 MED ORDER — DEXAMETHASONE SODIUM PHOSPHATE 10 MG/ML IJ SOLN
10.0000 mg | Freq: Once | INTRAMUSCULAR | Status: AC
  Administered 2012-08-17: 10 mg via INTRAMUSCULAR
  Filled 2012-08-17: qty 1

## 2012-08-17 MED ORDER — IBUPROFEN 800 MG PO TABS
800.0000 mg | ORAL_TABLET | Freq: Once | ORAL | Status: AC
  Administered 2012-08-17: 800 mg via ORAL
  Filled 2012-08-17: qty 1

## 2012-08-17 MED ORDER — IBUPROFEN 800 MG PO TABS: 800.0000 mg | ORAL_TABLET | Freq: Three times a day (TID) | ORAL | Status: DC

## 2012-08-17 MED ORDER — LEVOFLOXACIN 500 MG PO TABS: 500.0000 mg | ORAL_TABLET | Freq: Every day | ORAL | Status: DC

## 2012-08-17 NOTE — ED Provider Notes (Signed)
History  This chart was scribed for Vida Roller, MD by Shari Heritage, ED Scribe. The patient was seen in room APA03/APA03. Patient's care was started at 0825.   CSN: [id number]  Arrival date & time 08/17/12  0825   Chief Complaint  Patient presents with  . Sore Throat     The history is provided by the patient. No language interpreter was used.    HPI Comments: David [name] is a 22 y.o. male with a history of asthma who presents to the Emergency Department complaining of moderate sore throat and erythema to posterior pharynx. Onset this morning when he woke up. Patient says that he is not having any trouble swallowing and is able to handle secretions. He has no difficulty breathing. He has no drooling. Patient denies fever, chest pain, chest tightness, abdominal pain, shortness of breath, athralgias, myalgia, visual changes or rash. Patient reports an allergy to Augmentin and states that the last time he took it, he went into respiratory distress. He has a history of right leg surgery. He has no history of inf mono or strep a pharyngitis.  Past Medical History  Diagnosis Date  . Asthma     Past Surgical History  Procedure Laterality Date  . Leg surgery      right    Family History  Problem Relation Age of Onset  . Cancer Mother   . COPD Mother     History  Substance Use Topics  . Smoking status: Never Smoker   . Smokeless tobacco: Never Used  . Alcohol Use: No      Review of Systems  Constitutional: Negative for fever and chills.  HENT: Positive for sore throat.   Eyes: Negative for visual disturbance.  Respiratory: Positive for cough. Negative for chest tightness and shortness of breath.   Cardiovascular: Negative for chest pain.  Gastrointestinal: Negative for abdominal pain.  Musculoskeletal: Negative for myalgias, back pain and arthralgias.  Skin: Negative for rash.  All other systems reviewed and are negative.    Allergies  Amoxicillin-pot clavulanate  and Passion fruit flavor  Home Medications   Current Outpatient Rx  Name  Route  Sig  Dispense  Refill  . diphenhydrAMINE (BENADRYL) 25 MG tablet   Oral   Take 50 mg by mouth every 6 (six) hours as needed. For rash         . ibuprofen (ADVIL,MOTRIN) 800 MG tablet   Oral   Take 1 tablet (800 mg total) by mouth 3 (three) times daily.   21 tablet   0   . levofloxacin (LEVAQUIN) 500 MG tablet   Oral   Take 1 tablet (500 mg total) by mouth daily.   7 tablet   0   . predniSONE (DELTASONE) 10 MG tablet      5,4,3,2,1 - take with food   15 tablet   0     Triage Vitals: BP 157/64  Pulse 93  Temp(Src) 98.1 F (36.7 C) (Oral)  SpO2 100%  Physical Exam  Constitutional: He is oriented to person, place, and time. He appears well-developed and well-nourished. No distress.  HENT:  Head: Normocephalic and atraumatic.  Right Ear: Tympanic membrane, external ear and ear canal normal.  Left Ear: Tympanic membrane, external ear and ear canal normal.  Mouth/Throat: Oropharynx is clear and moist.  Enlarged, erythematous tonsils with exudate. No stridor. No trismus. No drooling noted. No cellulitis to the floor of the mouth.   Eyes: Conjunctivae and EOM are normal. Pupils  are equal, round, and reactive to light. No scleral icterus.  Neck: Normal range of motion. Neck supple.  Cardiovascular: Normal rate, regular rhythm and normal heart sounds.  Exam reveals no gallop and no friction rub.   No murmur heard. Pulmonary/Chest: Effort normal and breath sounds normal. No respiratory distress. He has no wheezes. He has no rales.  Abdominal: Soft. There is no hepatosplenomegaly. There is no tenderness.  Musculoskeletal: Normal range of motion.  Lymphadenopathy:    He has cervical adenopathy (anterior).  Neurological: He is alert and oriented to person, place, and time.  Skin: Skin is warm and dry.    ED Course  Procedures (including critical care time) DIAGNOSTIC STUDIES: Oxygen  Saturation is 100% on room air, normal by my interpretation.    COORDINATION OF CARE: 8:42 AM- Patient informed of current plan for treatment and evaluation and agrees with plan at this time.      Labs Reviewed - No data to display No results found.   1. Strep throat       MDM  22 yo male with sore throat. Exam c/w strep a pharyngitis. DDx: strep throat, viral pharyngitis. At this time tonsils are enlarged but patient has no signs of PTA or respiratory compromise and no change in phonation from such. Patient needs recheck in 24 hours and needs to return to ED if symptoms worsen. Treat with Dexamethasone here, Ibuprofen, and Levofloxacin per our strep sensitivities and patient's Pen allergy.  Decadron Levaquin  I personally performed the services described in this documentation, which was scribed in my presence. The recorded information has been reviewed and is accurate.      Vida Roller, MD 08/17/12 [phone]

## 2012-08-17 NOTE — ED Notes (Signed)
Woke up with sore throat this morning. Throat is red and swollen.

## 2013-02-04 DIAGNOSIS — J45909 Unspecified asthma, uncomplicated: Secondary | ICD-10-CM | POA: Insufficient documentation

## 2014-05-21 ENCOUNTER — Emergency Department (HOSPITAL_COMMUNITY)
Admission: EM | Admit: 2014-05-21 | Discharge: 2014-05-21 | Disposition: A | Discharge status: Home / Self Care | Payer: BC Managed Care – PPO | Attending: Emergency Medicine | Admitting: Emergency Medicine

## 2014-05-21 ENCOUNTER — Encounter (HOSPITAL_COMMUNITY): Payer: Self-pay | Admitting: *Deleted

## 2014-05-21 DIAGNOSIS — Z91040 Latex allergy status: Secondary | ICD-10-CM | POA: Insufficient documentation

## 2014-05-21 DIAGNOSIS — Z7952 Long term (current) use of systemic steroids: Secondary | ICD-10-CM | POA: Insufficient documentation

## 2014-05-21 DIAGNOSIS — Z792 Long term (current) use of antibiotics: Secondary | ICD-10-CM | POA: Insufficient documentation

## 2014-05-21 DIAGNOSIS — L309 Dermatitis, unspecified: Secondary | ICD-10-CM | POA: Insufficient documentation

## 2014-05-21 DIAGNOSIS — Z791 Long term (current) use of non-steroidal anti-inflammatories (NSAID): Secondary | ICD-10-CM | POA: Insufficient documentation

## 2014-05-21 DIAGNOSIS — Z880 Allergy status to penicillin: Secondary | ICD-10-CM | POA: Insufficient documentation

## 2014-05-21 DIAGNOSIS — H539 Unspecified visual disturbance: Secondary | ICD-10-CM | POA: Insufficient documentation

## 2014-05-21 DIAGNOSIS — J45909 Unspecified asthma, uncomplicated: Secondary | ICD-10-CM | POA: Insufficient documentation

## 2014-05-21 MED ORDER — PREDNISONE 50 MG PO TABS
60.0000 mg | ORAL_TABLET | Freq: Once | ORAL | Status: AC
  Administered 2014-05-21: 60 mg via ORAL
  Filled 2014-05-21 (×2): qty 1

## 2014-05-21 MED ORDER — FAMOTIDINE 20 MG PO TABS: 20.0000 mg | ORAL_TABLET | Freq: Two times a day (BID) | ORAL | Status: DC

## 2014-05-21 MED ORDER — METHYLPREDNISOLONE SODIUM SUCC 125 MG IJ SOLR
125.0000 mg | Freq: Once | INTRAMUSCULAR | Status: AC
  Administered 2014-05-21: 125 mg via INTRAMUSCULAR
  Filled 2014-05-21: qty 2

## 2014-05-21 MED ORDER — PREDNISONE 20 MG PO TABS: ORAL_TABLET | ORAL | Status: DC

## 2014-05-21 NOTE — ED Notes (Signed)
Pt reports trying a spray of colleen at [employer] Sunday. Has had a rash since. Denies any new food, meds, clothes or detergents.

## 2014-05-21 NOTE — ED Provider Notes (Signed)
CSN: [id number]     Arrival date & time 05/21/14  [id number] History  This chart was scribed for David GivensIva L Neng Albee, MD by David [name], ED Scribe. This patient was seen in room APA06/APA06 and the patient's care was started at 7:29 AM.    Chief Complaint  Patient presents with  . Allergic Reaction   The history is provided by the patient. No language interpreter was used.     HPI Comments: David [name] is a 23 y.o. male with past medical history of asthma who presents to the Emergency Department complaining of 3 days of itchiness, swelling and burning in his left eye with tear-like  drainage from this eye and blurred vision. He also reports a rash on the volar aspects of both wrists, in the creases of his elbows, and up the right side of his neck and torso. He feels as though the roof of his mouth is swollen; his lips are itchy. He denies swelling to the lips, difficulty swallowing or breathing. He denies prior experience with similar symptoms. Patient denies a history of eczema, but was seen in the ED in 2013 and diagnosed with eczema. He has been using Benadryl (25mg  1x per day at bedtime), Zyrtec (OTC 10mg  1x per day), and Gold Bond Anti-itch cream with no relief.   Patient reports an incident 4 days PTA where he accidentally sprayed cologne into his face; but did not get it in his eye,  he is unsure if this may be related to the symptoms in his eye. His symptoms started the next day.   Patient notes an extreme allergy to poison ivy, poison oak. Patient denies smoking, drinking. He works at [employer].   PCP none  Past Medical History  Diagnosis Date  . Asthma    Past Surgical History  Procedure Laterality Date  . Leg surgery      right   Family History  Problem Relation Age of Onset  . Cancer Mother   . COPD Mother    History  Substance Use Topics  . Smoking status: Never Smoker   . Smokeless tobacco: Never Used  . Alcohol Use: No  employed  Review of Systems  Eyes: Positive for pain,  discharge, redness, itching and visual disturbance.  Skin: Positive for rash.  All other systems reviewed and are negative.   Allergies  Amoxicillin-pot clavulanate; Passion fruit flavor; and Latex  Home Medications   Prior to Admission medications   Medication Sig Start Date End Date Taking? Authorizing Provider  cetirizine (ZYRTEC) 10 MG tablet Take 10 mg by mouth daily.   Yes Historical Provider, MD  diphenhydrAMINE (BENADRYL) 25 MG tablet Take 50 mg by mouth every 6 (six) hours as needed. For rash   Yes Historical Provider, MD  ibuprofen (ADVIL,MOTRIN) 800 MG tablet Take 1 tablet (800 mg total) by mouth 3 (three) times daily. 08/17/12   Vida RollerBrian D Miller, MD  levofloxacin (LEVAQUIN) 500 MG tablet Take 1 tablet (500 mg total) by mouth daily. 08/17/12   Vida RollerBrian D Miller, MD  predniSONE (DELTASONE) 10 MG tablet 5,4,3,2,1 - take with food 12/13/11   Kathie DikeHobson M Bryant, PA-C   BP 146/75 mmHg  Pulse 84  Temp(Src) 98 F (36.7 C) (Oral)  Resp 20  Ht 5\' 10"  (1.778 m)  Wt 285 lb (129.275 kg)  BMI 40.89 kg/m2  SpO2 99%  Vital signs normal   Physical Exam  Constitutional: He is oriented to person, place, and time. He appears well-developed and well-nourished.  Non-toxic appearance. He does not appear ill. No distress.  HENT:  Head: Normocephalic and atraumatic.  Right Ear: External ear normal.  Left Ear: External ear normal.  Nose: Nose normal. No mucosal edema or rhinorrhea.  Mouth/Throat: Oropharynx is clear and moist and mucous membranes are normal. No dental abscesses or uvula swelling.  Eyes: Conjunctivae and EOM are normal. Pupils are equal, round, and reactive to light.  Left eyelid skin appears thickened and red  Neck: Normal range of motion and full passive range of motion without pain. Neck supple.  Large area of redness with some thickening of the skin  Cardiovascular: Normal rate, regular rhythm and normal heart sounds.  Exam reveals no gallop and no friction rub.   No murmur  heard. Pulmonary/Chest: Effort normal and breath sounds normal. No respiratory distress. He has no wheezes. He has no rhonchi. He has no rales. He exhibits no tenderness and no crepitus.  Abdominal: Soft. Normal appearance and bowel sounds are normal. He exhibits no distension. There is no tenderness. There is no rebound and no guarding.  Musculoskeletal: Normal range of motion. He exhibits no edema or tenderness.  Moves all extremities well.   Neurological: He is alert and oriented to person, place, and time. He has normal strength. No cranial nerve deficit.  Skin: Skin is warm, dry and intact. Rash noted. No erythema. No pallor.     Are of redness in the lower right axilla Redness in antecubital areas, L>R Very minimal involvement of the volar aspect of both wrists  Psychiatric: He has a normal mood and affect. His speech is normal and behavior is normal. His mood appears not anxious.  Nursing note and vitals reviewed.       ED Course  Procedures  Medications  methylPREDNISolone sodium succinate (SOLU-MEDROL) 125 mg/2 mL injection 125 mg (125 mg Intramuscular Given 05/21/14 0744)  predniSONE (DELTASONE) tablet 60 mg (60 mg Oral Given 05/21/14 0743)     DIAGNOSTIC STUDIES: Oxygen Saturation is 99% on RA, normal by my interpretation.    COORDINATION OF CARE: 7:38 AM Discussed treatment plan with pt at bedside and pt agreed to plan.  Patient has history of asthma with rash in a pattern typical for eczema. He's been seen in the ED about 2 years ago and diagnosis of eczema at that time. Regardless medications for contact dermatitis and eczema would be similar.   Labs Review Labs Reviewed - No data to display  Imaging Review No results found.   EKG Interpretation None      MDM   Final diagnoses:  Eczema    Discharge Medication List as of 05/21/2014  7:44 AM    START taking these medications   Details  famotidine (PEPCID) 20 MG tablet Take 1 tablet (20 mg total) by  mouth 2 (two) times daily., Starting 05/21/2014, Until Discontinued, Print      Prednisone on taper  continue his over-the-counter Benadryl and Zyrtec  Plan discharge  David AlbeIva Aishah Teffeteller, MD, FACEP      I personally performed the services described in this documentation, which was scribed in my presence. The recorded information has been reviewed and considered.  David AlbeIva Dollie Bressi, MD, Armando GangFACEP      David GivensIva L Melvina Pangelinan, MD 05/21/14 [phone]

## 2014-05-21 NOTE — Discharge Instructions (Signed)
Continue the zyrtec once a day. You can increase the benadryl to 50 mg every 6 hrs for itching as needed. Take the prescriptions as prescribed. You can use OTC benadryl or cortaid ointments OTC on the areas (not your eyelids). Recheck if you get worse such as the rash continues to spread, you have difficulty swallowing or breathing, fever.    Contact Dermatitis Contact dermatitis is a reaction to certain substances that touch the skin. Contact dermatitis can be either irritant contact dermatitis or allergic contact dermatitis. Irritant contact dermatitis does not require previous exposure to the substance for a reaction to occur.Allergic contact dermatitis only occurs if you have been exposed to the substance before. Upon a repeat exposure, your body reacts to the substance.  CAUSES  Many substances can cause contact dermatitis. Irritant dermatitis is most commonly caused by repeated exposure to mildly irritating substances, such as:  Makeup.  Soaps.  Detergents.  Bleaches.  Acids.  Metal salts, such as nickel. Allergic contact dermatitis is most commonly caused by exposure to:  Poisonous plants.  Chemicals (deodorants, shampoos).  Jewelry.  Latex.  Neomycin in triple antibiotic cream.  Preservatives in products, including clothing. SYMPTOMS  The area of skin that is exposed may develop:  Dryness or flaking.  Redness.  Cracks.  Itching.  Pain or a burning sensation.  Blisters. With allergic contact dermatitis, there may also be swelling in areas such as the eyelids, mouth, or genitals.  DIAGNOSIS  Your caregiver can usually tell what the problem is by doing a physical exam. In cases where the cause is uncertain and an allergic contact dermatitis is suspected, a patch skin test may be performed to help determine the cause of your dermatitis. TREATMENT Treatment includes protecting the skin from further contact with the irritating substance by avoiding that substance  if possible. Barrier creams, powders, and gloves may be helpful. Your caregiver may also recommend:  Steroid creams or ointments applied 2 times daily. For best results, soak the rash area in cool water for 20 minutes. Then apply the medicine. Cover the area with a plastic wrap. You can store the steroid cream in the refrigerator for a "chilly" effect on your rash. That may decrease itching. Oral steroid medicines may be needed in more severe cases.  Antibiotics or antibacterial ointments if a skin infection is present.  Antihistamine lotion or an antihistamine taken by mouth to ease itching.  Lubricants to keep moisture in your skin.  Burow's solution to reduce redness and soreness or to dry a weeping rash. Mix one packet or tablet of solution in 2 cups cool water. Dip a clean washcloth in the mixture, wring it out a bit, and put it on the affected area. Leave the cloth in place for 30 minutes. Do this as often as possible throughout the day.  Taking several cornstarch or baking soda baths daily if the area is too large to cover with a washcloth. Harsh chemicals, such as alkalis or acids, can cause skin damage that is like a burn. You should flush your skin for 15 to 20 minutes with cold water after such an exposure. You should also seek immediate medical care after exposure. Bandages (dressings), antibiotics, and pain medicine may be needed for severely irritated skin.  HOME CARE INSTRUCTIONS  Avoid the substance that caused your reaction.  Keep the area of skin that is affected away from hot water, soap, sunlight, chemicals, acidic substances, or anything else that would irritate your skin.  Do not scratch  the rash. Scratching may cause the rash to become infected.  You may take cool baths to help stop the itching.  Only take over-the-counter or prescription medicines as directed by your caregiver.  See your caregiver for follow-up care as directed to make sure your skin is healing  properly. SEEK MEDICAL CARE IF:   Your condition is not better after 3 days of treatment.  You seem to be getting worse.  You see signs of infection such as swelling, tenderness, redness, soreness, or warmth in the affected area.  You have any problems related to your medicines. Document Released: 06/03/2000 Document Revised: 08/29/2011 Document Reviewed: 11/09/2010 [facility] Patient Information 2015 [place], [place]. This information is not intended to replace advice given to you by your health care provider. Make sure you discuss any questions you have with your health care provider.  Eczema Eczema, also called atopic dermatitis, is a skin disorder that causes inflammation of the skin. It causes a red rash and dry, scaly skin. The skin becomes very itchy. Eczema is generally worse during the cooler winter months and often improves with the warmth of summer. Eczema usually starts showing signs in infancy. Some children outgrow eczema, but it may last through adulthood.  CAUSES  The exact cause of eczema is not known, but it appears to run in families. People with eczema often have a family history of eczema, allergies, asthma, or hay fever. Eczema is not contagious. Flare-ups of the condition may be caused by:   Contact with something you are sensitive or allergic to.   Stress. SIGNS AND SYMPTOMS  Dry, scaly skin.   Red, itchy rash.   Itchiness. This may occur before the skin rash and may be very intense.  DIAGNOSIS  The diagnosis of eczema is usually made based on symptoms and medical history. TREATMENT  Eczema cannot be cured, but symptoms usually can be controlled with treatment and other strategies. A treatment plan might include:  Controlling the itching and scratching.   Use over-the-counter antihistamines as directed for itching. This is especially useful at night when the itching tends to be worse.   Use over-the-counter steroid creams as directed for itching.    Avoid scratching. Scratching makes the rash and itching worse. It may also result in a skin infection (impetigo) due to a break in the skin caused by scratching.   Keeping the skin well moisturized with creams every day. This will seal in moisture and help prevent dryness. Lotions that contain alcohol and water should be avoided because they can dry the skin.   Limiting exposure to things that you are sensitive or allergic to (allergens).   Recognizing situations that cause stress.   Developing a plan to manage stress.  HOME CARE INSTRUCTIONS   Only take over-the-counter or prescription medicines as directed by your health care provider.   Do not use anything on the skin without checking with your health care provider.   Keep baths or showers short (5 minutes) in warm (not hot) water. Use mild cleansers for bathing. These should be unscented. You may add nonperfumed bath oil to the bath water. It is best to avoid soap and bubble bath.   Immediately after a bath or shower, when the skin is still damp, apply a moisturizing ointment to the entire body. This ointment should be a petroleum ointment. This will seal in moisture and help prevent dryness. The thicker the ointment, the better. These should be unscented.   Keep fingernails cut short. Children with  eczema may need to wear soft gloves or mittens at night after applying an ointment.   Dress in clothes made of cotton or cotton blends. Dress lightly, because heat increases itching.   A child with eczema should stay away from anyone with fever blisters or cold sores. The virus that causes fever blisters (herpes simplex) can cause a serious skin infection in children with eczema. SEEK MEDICAL CARE IF:   Your itching interferes with sleep.   Your rash gets worse or is not better within 1 week after starting treatment.   You see pus or soft yellow scabs in the rash area.   You have a fever.   You have a rash  flare-up after contact with someone who has fever blisters.  Document Released: 06/03/2000 Document Revised: 03/27/2013 Document Reviewed: 01/07/2013 [facility]ExitCare Patient Information 2015 [place], [place]. This information is not intended to replace advice given to you by your health care provider. Make sure you discuss any questions you have with your health care provider.

## 2015-11-27 ENCOUNTER — Emergency Department (HOSPITAL_COMMUNITY): Payer: BLUE CROSS/BLUE SHIELD

## 2015-11-27 ENCOUNTER — Encounter (HOSPITAL_COMMUNITY): Payer: Self-pay

## 2015-11-27 ENCOUNTER — Emergency Department (HOSPITAL_COMMUNITY)
Admission: EM | Admit: 2015-11-27 | Discharge: 2015-11-27 | Disposition: A | Discharge status: Home / Self Care | Payer: BLUE CROSS/BLUE SHIELD | Attending: Emergency Medicine | Admitting: Emergency Medicine

## 2015-11-27 DIAGNOSIS — R42 Dizziness and giddiness: Secondary | ICD-10-CM | POA: Diagnosis not present

## 2015-11-27 DIAGNOSIS — Z79899 Other long term (current) drug therapy: Secondary | ICD-10-CM | POA: Insufficient documentation

## 2015-11-27 DIAGNOSIS — I10 Essential (primary) hypertension: Secondary | ICD-10-CM | POA: Insufficient documentation

## 2015-11-27 DIAGNOSIS — J45909 Unspecified asthma, uncomplicated: Secondary | ICD-10-CM | POA: Diagnosis not present

## 2015-11-27 DIAGNOSIS — R0789 Other chest pain: Secondary | ICD-10-CM | POA: Diagnosis not present

## 2015-11-27 HISTORY — DX: Essential (primary) hypertension: I10

## 2015-11-27 LAB — BASIC METABOLIC PANEL
Anion gap: 7 (ref 5–15)
BUN: 13 mg/dL (ref 6–20)
CO2: 26 mmol/L (ref 22–32)
Calcium: 8.9 mg/dL (ref 8.9–10.3)
Chloride: 102 mmol/L (ref 101–111)
Creatinine, Ser: 0.91 mg/dL (ref 0.61–1.24)
GFR calc Af Amer: >60 mL/min (ref >60)
GFR calc non Af Amer: >60 mL/min (ref >60)
Glucose, Bld: 89 mg/dL (ref 65–99)
Potassium: 3.7 mmol/L (ref 3.5–5.1)
Sodium: 135 mmol/L (ref 135–145)

## 2015-11-27 LAB — CBC WITH DIFFERENTIAL/PLATELET
Basophils Absolute: 0 10*3/uL (ref 0.0–0.1)
Basophils Relative: 0 %
Eosinophils Absolute: 0.2 10*3/uL (ref 0.0–0.7)
Eosinophils Relative: 3 %
HCT: 39.4 % (ref 39.0–52.0)
Hemoglobin: 13.1 g/dL (ref 13.0–17.0)
Lymphocytes Relative: 28 %
Lymphs Abs: 2.3 10*3/uL (ref 0.7–4.0)
MCH: 25.8 pg — ABNORMAL LOW (ref 26.0–34.0)
MCHC: 33.2 g/dL (ref 30.0–36.0)
MCV: 77.7 fL — ABNORMAL LOW (ref 78.0–100.0)
Monocytes Absolute: 0.4 10*3/uL (ref 0.1–1.0)
Monocytes Relative: 5 %
Neutro Abs: 5.4 10*3/uL (ref 1.7–7.7)
Neutrophils Relative %: 64 %
Platelets: 254 10*3/uL (ref 150–400)
RBC: 5.07 MIL/uL (ref 4.22–5.81)
RDW: 13.4 % (ref 11.5–15.5)
WBC: 8.2 10*3/uL (ref 4.0–10.5)

## 2015-11-27 MED ORDER — SODIUM CHLORIDE 0.9 % IV BOLUS (SEPSIS)
1000.0000 mL | Freq: Once | INTRAVENOUS | Status: AC
  Administered 2015-11-27: 1000 mL via INTRAVENOUS

## 2015-11-27 MED ORDER — IBUPROFEN 800 MG PO TABS: 800.0000 mg | ORAL_TABLET | Freq: Three times a day (TID) | ORAL | Status: DC | PRN

## 2015-11-27 MED ORDER — KETOROLAC TROMETHAMINE 30 MG/ML IJ SOLN
30.0000 mg | Freq: Once | INTRAMUSCULAR | Status: AC
  Administered 2015-11-27: 30 mg via INTRAVENOUS
  Filled 2015-11-27: qty 1

## 2015-11-27 NOTE — Discharge Instructions (Signed)
Chest Wall Pain Chest wall pain is pain in or around the bones and muscles of your chest. Sometimes, an injury causes this pain. Sometimes, the cause may not be known. This pain may take several weeks or longer to get better. HOME CARE INSTRUCTIONS  Pay attention to any changes in your symptoms. Take these actions to help with your pain:   Rest as told by your health care provider.   Avoid activities that cause pain. These include any activities that use your chest muscles or your abdominal and side muscles to lift heavy items.   If directed, apply ice to the painful area:  Put ice in a plastic bag.  Place a towel between your skin and the bag.  Leave the ice on for 20 minutes, 2-3 times per day.  Take over-the-counter and prescription medicines only as told by your health care provider.  Do not use tobacco products, including cigarettes, chewing tobacco, and e-cigarettes. If you need help quitting, ask your health care provider.  Keep all follow-up visits as told by your health care provider. This is important. SEEK MEDICAL CARE IF:  You have a fever.  Your chest pain becomes worse.  You have new symptoms. SEEK IMMEDIATE MEDICAL CARE IF:  You have nausea or vomiting.  You feel sweaty or light-headed.  You have a cough with phlegm (sputum) or you cough up blood.  You develop shortness of breath.   This information is not intended to replace advice given to you by your health care provider. Make sure you discuss any questions you have with your health care provider.   Document Released: 06/06/2005 Document Revised: 02/25/2015 Document Reviewed: 09/01/2014 Elsevier Interactive Patient Education 2016 [employer]  Dizziness Dizziness is a common problem. It is a feeling of unsteadiness or light-headedness. You may feel like you are about to faint. Dizziness can lead to injury if you stumble or fall. Anyone can become dizzy, but dizziness is more common in older adults.  This condition can be caused by a number of things, including medicines, dehydration, or illness. HOME CARE INSTRUCTIONS Taking these steps may help with your condition: Eating and Drinking  Drink enough fluid to keep your urine clear or pale yellow. This helps to keep you from becoming dehydrated. Try to drink more clear fluids, such as water.  Do not drink alcohol.  Limit your caffeine intake if directed by your health care provider.  Limit your salt intake if directed by your health care provider. Activity  Avoid making quick movements.  Rise slowly from chairs and steady yourself until you feel okay.  In the morning, first sit up on the side of the bed. When you feel okay, stand slowly while you hold onto something until you know that your balance is fine.  Move your legs often if you need to stand in one place for a long time. Tighten and relax your muscles in your legs while you are standing.  Do not drive or operate heavy machinery if you feel dizzy.  Avoid bending down if you feel dizzy. Place items in your home so that they are easy for you to reach without leaning over. Lifestyle  Do not use any tobacco products, including cigarettes, chewing tobacco, or electronic cigarettes. If you need help quitting, ask your health care provider.  Try to reduce your stress level, such as with yoga or meditation. Talk with your health care provider if you need help. General Instructions  Watch your dizziness for any changes.  Take medicines only as directed by your health care provider. Talk with your health care provider if you think that your dizziness is caused by a medicine that you are taking.  Tell a friend or a family member that you are feeling dizzy. If he or she notices any changes in your behavior, have this person call your health care provider.  Keep all follow-up visits as directed by your health care provider. This is important. SEEK MEDICAL CARE IF:  Your  dizziness does not go away.  Your dizziness or light-headedness gets worse.  You feel nauseous.  You have reduced hearing.  You have new symptoms.  You are unsteady on your feet or you feel like the room is spinning. SEEK IMMEDIATE MEDICAL CARE IF:  You vomit or have diarrhea and are unable to eat or drink anything.  You have problems talking, walking, swallowing, or using your arms, hands, or legs.  You feel generally weak.  You are not thinking clearly or you have trouble forming sentences. It may take a friend or family member to notice this.  You have chest pain, abdominal pain, shortness of breath, or sweating.  Your vision changes.  You notice any bleeding.  You have a headache.  You have neck pain or a stiff neck.  You have a fever.   This information is not intended to replace advice given to you by your health care provider. Make sure you discuss any questions you have with your health care provider.   Document Released: 11/30/2000 Document Revised: 10/21/2014 Document Reviewed: 06/02/2014 Elsevier Interactive Patient Education [employer]

## 2015-11-27 NOTE — ED Notes (Signed)
Pt reports onset of sharp stabbing pain to left chest that started yesterday afternoon.  Pt states he went to work and the pain has worsened, is worse with deep breath and movement.

## 2015-11-27 NOTE — ED Notes (Signed)
Pt made aware to return if symptoms worsen or if any life threatening symptoms occur.   

## 2015-11-27 NOTE — ED Provider Notes (Signed)
TIME SEEN: 5:50 AM  CHIEF COMPLAINT: Chest pain, lightheadedness  HPI: Pt is a 25 y.o. male with history of asthma, hypertension, obesity who presents emergency department with complaints of lightheadedness that has been present since yesterday morning. No aggravating or relieving factors. Says it had sharp, stabbing left chest pain without radiation. States is painful in one spot in his left chest and is worse with palpation, movement and inspiration. No fevers, cough, shortness of breath, nausea, vomiting, bloody stools or melena. Reports he has chronic diarrhea which is unchanged.  No history of PE, DVT, exogenous estrogen use, fracture, surgery, trauma, hospitalization, prolonged travel. No lower extremity swelling or pain. No calf tenderness.  He is not a smoker. No family history of premature CAD.   ROS: See HPI Constitutional: no fever  Eyes: no drainage  ENT: no runny nose   Cardiovascular:   chest pain  Resp: no SOB  GI: no vomiting GU: no dysuria Integumentary: no rash  Allergy: no hives  Musculoskeletal: no leg swelling  Neurological: no slurred speech ROS otherwise negative  PAST MEDICAL HISTORY/PAST SURGICAL HISTORY:  Past Medical History  Diagnosis Date  . Asthma   . Hypertension     MEDICATIONS:  Prior to Admission medications   Medication Sig Start Date End Date Taking? Authorizing Provider  cetirizine (ZYRTEC) 10 MG tablet Take 10 mg by mouth daily.    Historical Provider, MD  diphenhydrAMINE (BENADRYL) 25 MG tablet Take 50 mg by mouth every 6 (six) hours as needed. For rash    Historical Provider, MD  famotidine (PEPCID) 20 MG tablet Take 1 tablet (20 mg total) by mouth 2 (two) times daily. 05/21/14   Devoria AlbeIva Knapp, MD  ibuprofen (ADVIL,MOTRIN) 800 MG tablet Take 1 tablet (800 mg total) by mouth 3 (three) times daily. 08/17/12   Eber HongBrian Miller, MD  levofloxacin (LEVAQUIN) 500 MG tablet Take 1 tablet (500 mg total) by mouth daily. 08/17/12   Eber HongBrian Miller, MD  predniSONE  (DELTASONE) 20 MG tablet Take 3 po QD x 3d , then 2 po QD x 3d then 1 po QD x 3d 05/21/14   Devoria AlbeIva Knapp, MD    ALLERGIES:  Allergies  Allergen Reactions  . Amoxicillin-Pot Clavulanate Anaphylaxis  . Passion Fruit Flavor Anaphylaxis  . Latex Swelling    SOCIAL HISTORY:  Social History  Substance Use Topics  . Smoking status: Never Smoker   . Smokeless tobacco: Never Used  . Alcohol Use: No    FAMILY HISTORY: Family History  Problem Relation Age of Onset  . Cancer Mother   . COPD Mother     EXAM: BP 146/73 mmHg  Pulse 79  Temp(Src) 97.8 F (36.6 C) (Oral)  Resp 18  Ht 5\' 10"  (1.778 m)  Wt 287 lb (130.182 kg)  BMI 41.18 kg/m2  SpO2 100% CONSTITUTIONAL: Alert and oriented and responds appropriately to questions. Well-appearing; well-nourished, Smiling, laughing, in no apparent distress HEAD: Normocephalic EYES: Conjunctivae clear, PERRL ENT: normal nose; no rhinorrhea; moist mucous membranes NECK: Supple, no meningismus, no LAD  CARD: RRR; S1 and S2 appreciated; no murmurs, no clicks, no rubs, no gallops CHEST:  Left chest wall is tender to palpation without crepitus, ecchymosis or deformity. No flail chest. At rash or other lesions. Palpation of the left chest wall reproduces patient's pain. RESP: Normal chest excursion without splinting or tachypnea; breath sounds clear and equal bilaterally; no wheezes, no rhonchi, no rales, no hypoxia or respiratory distress, speaking full sentences ABD/GI: Normal bowel sounds; non-distended;  soft, non-tender, no rebound, no guarding, no peritoneal signs BACK:  The back appears normal and is non-tender to palpation, there is no CVA tenderness EXT: Normal ROM in all joints; non-tender to palpation; no edema; normal capillary refill; no cyanosis, no calf tenderness or swelling    SKIN: Normal color for age and race; warm; no rash NEURO: Moves all extremities equally, sensation to light touch intact diffusely, cranial nerves II through XII  intact, normal gait PSYCH: The patient's mood and manner are appropriate. Grooming and personal hygiene are appropriate.  MEDICAL DECISION MAKING: Patient here with what seems to be left chest wall pain. Pain reproducible with palpation and movement. EKG shows no ischemic abnormality. Doubt ACS, dissection or PE. We'll give Toradol and reassess.   Also complains of feeling lightheaded. Will check CBC, BMP to ensure no anemia, electrolyte abnormality. Denies infectious symptoms. Denies syncopal event. Denies neurologic deficits. We'll give IV fluids.  ED PROGRESS: Patient's labs are unremarkable. Chest x-ray clear. Reports having some improvement. Again doubt ACS, dissection or pulmonary embolus. I discussed his musculoskeletal pain. Have offered him further pain medication which he declines. He states he'll continue ibuprofen at home. He does not want narcotics as he is concerned about their addictive properties. I feel this is an appropriate plan. Discussed return precautions. Will give PCP follow-up information. He verbalizes understanding is comfortable with this plan.    At this time, I do not feel there is any life-threatening condition present. I have reviewed and discussed all results (EKG, imaging, lab, urine as appropriate), exam findings with patient. I have reviewed nursing notes and appropriate previous records.  I feel the patient is safe to be discharged home without further emergent workup. Discussed usual and customary return precautions. Patient and family (if present) verbalize understanding and are comfortable with this plan.  Patient will follow-up with their primary care provider. If they do not have a primary care provider, information for follow-up has been provided to them. All questions have been answered.     EKG Interpretation  Date/Time:  Friday November 27 2015 05:44:32 EDT Ventricular Rate:  77 PR Interval:  110 QRS Duration: 94 QT Interval:  365 QTC  Calculation: 413 R Axis:   70 Text Interpretation:  Sinus rhythm Borderline short PR interval Baseline wander in lead(s) I III aVR aVL No significant change since 2012 Confirmed by Stefania Goulart,  DO, Dmitri Pettigrew [phone]) on 11/27/2015 5:46:42 AM         Layla Maw Benn Tarver, DO 11/27/15 [id number]

## 2015-12-09 ENCOUNTER — Encounter (HOSPITAL_COMMUNITY): Payer: Self-pay

## 2015-12-09 ENCOUNTER — Emergency Department (HOSPITAL_COMMUNITY): Payer: BLUE CROSS/BLUE SHIELD

## 2015-12-09 ENCOUNTER — Emergency Department (HOSPITAL_COMMUNITY)
Admission: EM | Admit: 2015-12-09 | Discharge: 2015-12-09 | Disposition: A | Discharge status: Other Acute Inpatient Hospital | Payer: BLUE CROSS/BLUE SHIELD | Attending: Emergency Medicine | Admitting: Emergency Medicine

## 2015-12-09 ENCOUNTER — Other Ambulatory Visit: Payer: Self-pay

## 2015-12-09 DIAGNOSIS — I10 Essential (primary) hypertension: Secondary | ICD-10-CM | POA: Insufficient documentation

## 2015-12-09 DIAGNOSIS — J45909 Unspecified asthma, uncomplicated: Secondary | ICD-10-CM | POA: Insufficient documentation

## 2015-12-09 DIAGNOSIS — Y929 Unspecified place or not applicable: Secondary | ICD-10-CM | POA: Diagnosis not present

## 2015-12-09 DIAGNOSIS — Y939 Activity, unspecified: Secondary | ICD-10-CM | POA: Insufficient documentation

## 2015-12-09 DIAGNOSIS — X58XXXA Exposure to other specified factors, initial encounter: Secondary | ICD-10-CM | POA: Insufficient documentation

## 2015-12-09 DIAGNOSIS — J988 Other specified respiratory disorders: Secondary | ICD-10-CM | POA: Diagnosis present

## 2015-12-09 DIAGNOSIS — Y999 Unspecified external cause status: Secondary | ICD-10-CM | POA: Diagnosis not present

## 2015-12-09 DIAGNOSIS — T17298A Other foreign object in pharynx causing other injury, initial encounter: Secondary | ICD-10-CM | POA: Diagnosis not present

## 2015-12-09 DIAGNOSIS — T17208A Unspecified foreign body in pharynx causing other injury, initial encounter: Secondary | ICD-10-CM

## 2015-12-09 LAB — CBC WITH DIFFERENTIAL/PLATELET
Basophils Absolute: 0 10*3/uL (ref 0.0–0.1)
Basophils Relative: 0 %
Eosinophils Absolute: 0.3 10*3/uL (ref 0.0–0.7)
Eosinophils Relative: 3 %
HCT: 43.9 % (ref 39.0–52.0)
Hemoglobin: 14.9 g/dL (ref 13.0–17.0)
Lymphocytes Relative: 30 %
Lymphs Abs: 3.2 10*3/uL (ref 0.7–4.0)
MCH: 26.3 pg (ref 26.0–34.0)
MCHC: 33.9 g/dL (ref 30.0–36.0)
MCV: 77.4 fL — ABNORMAL LOW (ref 78.0–100.0)
Monocytes Absolute: 0.9 10*3/uL (ref 0.1–1.0)
Monocytes Relative: 9 %
Neutro Abs: 6.4 10*3/uL (ref 1.7–7.7)
Neutrophils Relative %: 58 %
Platelets: 274 10*3/uL (ref 150–400)
RBC: 5.67 MIL/uL (ref 4.22–5.81)
RDW: 13.7 % (ref 11.5–15.5)
WBC: 10.8 10*3/uL — ABNORMAL HIGH (ref 4.0–10.5)

## 2015-12-09 LAB — BASIC METABOLIC PANEL
Anion gap: 9 (ref 5–15)
BUN: 14 mg/dL (ref 6–20)
CO2: 24 mmol/L (ref 22–32)
Calcium: 9 mg/dL (ref 8.9–10.3)
Chloride: 102 mmol/L (ref 101–111)
Creatinine, Ser: 0.83 mg/dL (ref 0.61–1.24)
GFR calc Af Amer: >60 mL/min (ref >60)
GFR calc non Af Amer: >60 mL/min (ref >60)
Glucose, Bld: 103 mg/dL — ABNORMAL HIGH (ref 65–99)
Potassium: 3.9 mmol/L (ref 3.5–5.1)
Sodium: 135 mmol/L (ref 135–145)

## 2015-12-09 MED ORDER — ONDANSETRON HCL 4 MG/2ML IJ SOLN
INTRAMUSCULAR | Status: AC
  Administered 2015-12-09: 8 mg via INTRAVENOUS
  Filled 2015-12-09: qty 4

## 2015-12-09 MED ORDER — DIAZEPAM 5 MG/ML IJ SOLN
2.5000 mg | Freq: Once | INTRAMUSCULAR | Status: AC
  Administered 2015-12-09: 2.5 mg via INTRAVENOUS

## 2015-12-09 MED ORDER — DIAZEPAM 5 MG/ML IJ SOLN
INTRAMUSCULAR | Status: AC
  Administered 2015-12-09: 2.5 mg via INTRAVENOUS
  Filled 2015-12-09: qty 2

## 2015-12-09 MED ORDER — ONDANSETRON HCL 4 MG/2ML IJ SOLN
8.0000 mg | Freq: Once | INTRAMUSCULAR | Status: AC
Start: 2015-12-09 — End: 2015-12-09
  Administered 2015-12-09: 8 mg via INTRAVENOUS

## 2015-12-09 MED ORDER — DIAZEPAM 5 MG/ML IJ SOLN: 2.0000 mg | Freq: Once | INTRAMUSCULAR | Status: DC

## 2015-12-09 MED ORDER — MORPHINE SULFATE (PF) 4 MG/ML IV SOLN
4.0000 mg | Freq: Once | INTRAVENOUS | Status: AC
  Administered 2015-12-09: 4 mg via INTRAVENOUS
  Filled 2015-12-09: qty 1

## 2015-12-09 MED ORDER — DIAZEPAM 5 MG/ML IJ SOLN
2.5000 mg | Freq: Once | INTRAMUSCULAR | Status: AC
  Administered 2015-12-09: 2.5 mg via INTRAVENOUS
  Filled 2015-12-09: qty 2

## 2015-12-09 MED ORDER — SODIUM CHLORIDE 0.9 % IV BOLUS (SEPSIS)
1000.0000 mL | Freq: Once | INTRAVENOUS | Status: AC
  Administered 2015-12-09: 1000 mL via INTRAVENOUS

## 2015-12-09 MED ORDER — GLUCAGON HCL RDNA (DIAGNOSTIC) 1 MG IJ SOLR
INTRAMUSCULAR | Status: AC
  Administered 2015-12-09: 1 mg via INTRAVENOUS
  Filled 2015-12-09: qty 1

## 2015-12-09 MED ORDER — GLUCAGON HCL RDNA (DIAGNOSTIC) 1 MG IJ SOLR
1.0000 mg | Freq: Once | INTRAMUSCULAR | Status: AC
  Administered 2015-12-09: 1 mg via INTRAVENOUS

## 2015-12-09 NOTE — ED Provider Notes (Signed)
CSN: [id number]     Arrival date & time 12/09/15  1656 History   First MD Initiated Contact with Patient 12/09/15 1708     Chief Complaint  Patient presents with  . Airway Obstruction     (Consider location/radiation/quality/duration/timing/severity/associated sxs/prior Treatment) HPI Comments: Level V caveat for acuity of condition. Patient presents with acute onset difficulty swallowing and breathing while eating chicken which had bones. Relative at bedside states patient was eating normally then all of a sudden became choked after eating a piece of chicken. Head some spitting up of white foam. Complained of pain in throat difficulty breathing. On arrival he is in distress, not hypoxic. He complains of throat pain and is spitting into an emesis bag. denies any chest pain or shortness of breath. Denies abdominal pain. Reports never had an issue with this in the past.  The history is provided by the patient and a relative. The history is limited by the condition of the patient.    Past Medical History  Diagnosis Date  . Asthma   . Hypertension    Past Surgical History  Procedure Laterality Date  . Leg surgery      right   Family History  Problem Relation Age of Onset  . Cancer Mother   . COPD Mother    Social History  Substance Use Topics  . Smoking status: Never Smoker   . Smokeless tobacco: Never Used  . Alcohol Use: No    Review of Systems  Unable to perform ROS: Acuity of condition      Allergies  Amoxicillin-pot clavulanate; Passion fruit flavor; and Latex  Home Medications   Prior to Admission medications   Medication Sig Start Date End Date Taking? Authorizing Provider  ibuprofen (ADVIL,MOTRIN) 800 MG tablet Take 1 tablet (800 mg total) by mouth every 8 (eight) hours as needed for mild pain. 11/27/15   Kristen N Ward, DO   BP 150/63 mmHg  Pulse 89  Temp(Src) 98.8 F (37.1 C) (Oral)  Resp 22  Ht 5\' 10"  (1.778 m)  Wt 285 lb (129.275 kg)  BMI 40.89 kg/m2   SpO2 97% Physical Exam  Constitutional: He is oriented to person, place, and time. He appears well-developed and well-nourished. He appears distressed.  Spitting in emesis bag  HENT:  Head: Normocephalic and atraumatic.  Mouth/Throat: Oropharynx is clear and moist. No oropharyngeal exudate.  Enlarged tonsils. Uvula midline. No stridor.   Eyes: Conjunctivae and EOM are normal. Pupils are equal, round, and reactive to light.  Neck: Normal range of motion. Neck supple.  No meningismus.  Cardiovascular: Normal rate, regular rhythm, normal heart sounds and intact distal pulses.   No murmur heard. Pulmonary/Chest: Effort normal and breath sounds normal. No respiratory distress. He has no wheezes.  Abdominal: Soft. There is no tenderness. There is no rebound and no guarding.  Musculoskeletal: Normal range of motion. He exhibits no edema or tenderness.  Neurological: He is alert and oriented to person, place, and time. No cranial nerve deficit. He exhibits normal muscle tone. Coordination normal.  No ataxia on finger to nose bilaterally. No pronator drift. 5/5 strength throughout. CN 2-12 intact.Equal grip strength. Sensation intact.   Skin: Skin is warm.  Psychiatric: He has a normal mood and affect. His behavior is normal.  Nursing note and vitals reviewed.   ED Course  Procedures (including critical care time) Labs Review Labs Reviewed  CBC WITH DIFFERENTIAL/PLATELET - Abnormal; Notable for the following:    WBC 10.8 (*)  MCV 77.4 (*)    All other components within normal limits  BASIC METABOLIC PANEL - Abnormal; Notable for the following:    Glucose, Bld 103 (*)    All other components within normal limits    Imaging Review Dg Neck Soft Tissue  12/09/2015  CLINICAL DATA:  Difficulty breathing. Pt was eating chicken and now feels as if stuck in throat. Vomiting. History of asthma, HTN. Pt was unable to come to dept, error on portable, unable to adjust the technique. EXAM: NECK  SOFT TISSUES - 1+ VIEW COMPARISON:  None. FINDINGS: Portable AP and lateral views. The AP view is degraded by technique and patient body habitus. On the lateral view, a radiopaque object projects over the hypopharynx posteriorly. Measures 1.7 cm. No prevertebral soft tissue swelling. Not well localized on the frontal view. IMPRESSION: Suboptimal, essentially nondiagnostic frontal radiograph. 17 mm radiopaque foreign object projecting over the posterior hypopharynx on the lateral view. Given the clinical history, bone fragment is suspected. Of note, this is not localized on the lateral view. Therefore, alternate explanation such as soft tissue calcification cannot be entirely excluded. CT may be necessary to localize further. Electronically Signed   By: Jeronimo Greaves M.D.   On: 12/09/2015 17:52   Dg Chest Portable 1 View  12/09/2015  CLINICAL DATA:  Difficulty breathing. Pt was eating chicken and now feels as if stuck in throat. Vomiting. History of asthma, HTN. EXAM: PORTABLE CHEST - 1 VIEW COMPARISON:  11/27/2015 FINDINGS: Lungs are clear. Heart size and mediastinal contours are within normal limits. No effusion. Visualized bones unremarkable. IMPRESSION: No acute cardiopulmonary disease. Electronically Signed   By: Corlis Leak M.D.   On: 12/09/2015 17:46   I have personally reviewed and evaluated these images and lab results as part of my medical decision-making.   EKG Interpretation   Date/Time:  Wednesday December 09 2015 17:15:48 EDT Ventricular Rate:  95 PR Interval:    QRS Duration: 96 QT Interval:  335 QTC Calculation: 422 R Axis:   46 Text Interpretation:  Sinus rhythm No significant change was found  Confirmed by Manus Gunning  MD, Jeanmarc Viernes [phone]) on 12/09/2015 5:22:14 PM      MDM   Final diagnoses:  Foreign body in hypopharynx, initial encounter   Patient with apparent food impaction. No foreign body seen in oropharynx. Oxygenation is normal and saturation is normal. Lungs are clear. No  stridor.  Patient given IV glucagon, antiemetics and Valium.  Some improvement with above medications. Patient is tolerating secretions and has normal oxygenation.  X-ray shows possible foreign body in the posterior hypopharynx. Discussed with the ENT doctor Jearld Fenton. He recommends possible evaluation by GI versus transfer to [facility] as he states the airway equipment at [facility] is suboptimal for this patient. Does not recommend any intervention in the ED without backup.  D/w Dr. Karilyn Cota. He reviewed Xrays and case. He feels the patient should be better served at a tertiary care facility with backup. Feels this is ENT territory as it involves the hypopharynx and his superior to the esophagus. Agrees that patient should not be laid down for CT scan.  D/w Dr. Orpah Cobb of [facility]. She accepts patient to [facility] ED.  Patient alert, tolerating secretions. No stridor, no hypoxia. Appears stable for transfer.  BP 133/63 mmHg  Pulse 77  Temp(Src) 98.8 F (37.1 C) (Oral)  Resp 18  Ht  (1.778 m)  Wt 285 lb (129.275 kg)  BMI 40.89 kg/m2  SpO2 98%   CRITICAL  CARE Performed by: Glynn Octave Total critical care time: 35 minutes Critical care time was exclusive of separately billable procedures and treating other patients. Critical care was necessary to treat or prevent imminent or life-threatening deterioration. Critical care was time spent personally by me on the following activities: development of treatment plan with patient and/or surrogate as well as nursing, discussions with consultants, evaluation of patient's response to treatment, examination of patient, obtaining history from patient or surrogate, ordering and performing treatments and interventions, ordering and review of laboratory studies, ordering and review of radiographic studies, pulse oximetry and re-evaluation of patient's condition.   Glynn Octave, MD 12/09/15 [phone]

## 2015-12-09 NOTE — ED Notes (Signed)
Pt was eating chicken and now feels as if stuck in throat.

## 2016-09-17 ENCOUNTER — Encounter (HOSPITAL_COMMUNITY): Payer: Self-pay | Admitting: *Deleted

## 2016-09-17 ENCOUNTER — Emergency Department (HOSPITAL_COMMUNITY): Payer: BLUE CROSS/BLUE SHIELD

## 2016-09-17 ENCOUNTER — Emergency Department (HOSPITAL_COMMUNITY)
Admission: EM | Admit: 2016-09-17 | Discharge: 2016-09-17 | Disposition: A | Discharge status: Home / Self Care | Payer: BLUE CROSS/BLUE SHIELD | Attending: Emergency Medicine | Admitting: Emergency Medicine

## 2016-09-17 DIAGNOSIS — I10 Essential (primary) hypertension: Secondary | ICD-10-CM | POA: Diagnosis not present

## 2016-09-17 DIAGNOSIS — Y999 Unspecified external cause status: Secondary | ICD-10-CM | POA: Insufficient documentation

## 2016-09-17 DIAGNOSIS — J45909 Unspecified asthma, uncomplicated: Secondary | ICD-10-CM | POA: Insufficient documentation

## 2016-09-17 DIAGNOSIS — S20219A Contusion of unspecified front wall of thorax, initial encounter: Secondary | ICD-10-CM | POA: Insufficient documentation

## 2016-09-17 DIAGNOSIS — Y92410 Unspecified street and highway as the place of occurrence of the external cause: Secondary | ICD-10-CM | POA: Insufficient documentation

## 2016-09-17 DIAGNOSIS — S60032A Contusion of left middle finger without damage to nail, initial encounter: Secondary | ICD-10-CM | POA: Insufficient documentation

## 2016-09-17 DIAGNOSIS — S5011XA Contusion of right forearm, initial encounter: Secondary | ICD-10-CM | POA: Diagnosis not present

## 2016-09-17 DIAGNOSIS — Y939 Activity, unspecified: Secondary | ICD-10-CM | POA: Diagnosis not present

## 2016-09-17 DIAGNOSIS — S299XXA Unspecified injury of thorax, initial encounter: Secondary | ICD-10-CM | POA: Diagnosis present

## 2016-09-17 MED ORDER — IBUPROFEN 800 MG PO TABS: 800.0000 mg | ORAL_TABLET | Freq: Three times a day (TID) | ORAL | 0 refills | Status: DC

## 2016-09-17 MED ORDER — IBUPROFEN 800 MG PO TABS
800.0000 mg | ORAL_TABLET | Freq: Once | ORAL | Status: AC
  Administered 2016-09-17: 800 mg via ORAL
  Filled 2016-09-17: qty 1

## 2016-09-17 MED ORDER — METHOCARBAMOL 500 MG PO TABS: 500.0000 mg | ORAL_TABLET | Freq: Two times a day (BID) | ORAL | 0 refills | Status: DC
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
## 2016-09-17 NOTE — Discharge Instructions (Signed)
See your Physicain for recheck if pain persist past one week. °
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
## 2017-01-20 ENCOUNTER — Ambulatory Visit (HOSPITAL_COMMUNITY)
Admission: RE | Admit: 2017-01-20 | Discharge: 2017-01-20 | Disposition: A | Discharge status: Home / Self Care | Payer: BLUE CROSS/BLUE SHIELD | Source: Ambulatory Visit | Attending: Obstetrics & Gynecology | Admitting: Obstetrics & Gynecology

## 2017-01-20 DIAGNOSIS — Z029 Encounter for administrative examinations, unspecified: Secondary | ICD-10-CM | POA: Insufficient documentation

## 2017-06-26 IMAGING — DX DG CHEST 2V
2 series · 2 of 2 positions shown · non-contrast
Comparison: 05/17/2011

CLINICAL DATA: Chest pain on the left since yesterday

EXAM:
CHEST  2 VIEW

[chest pa]
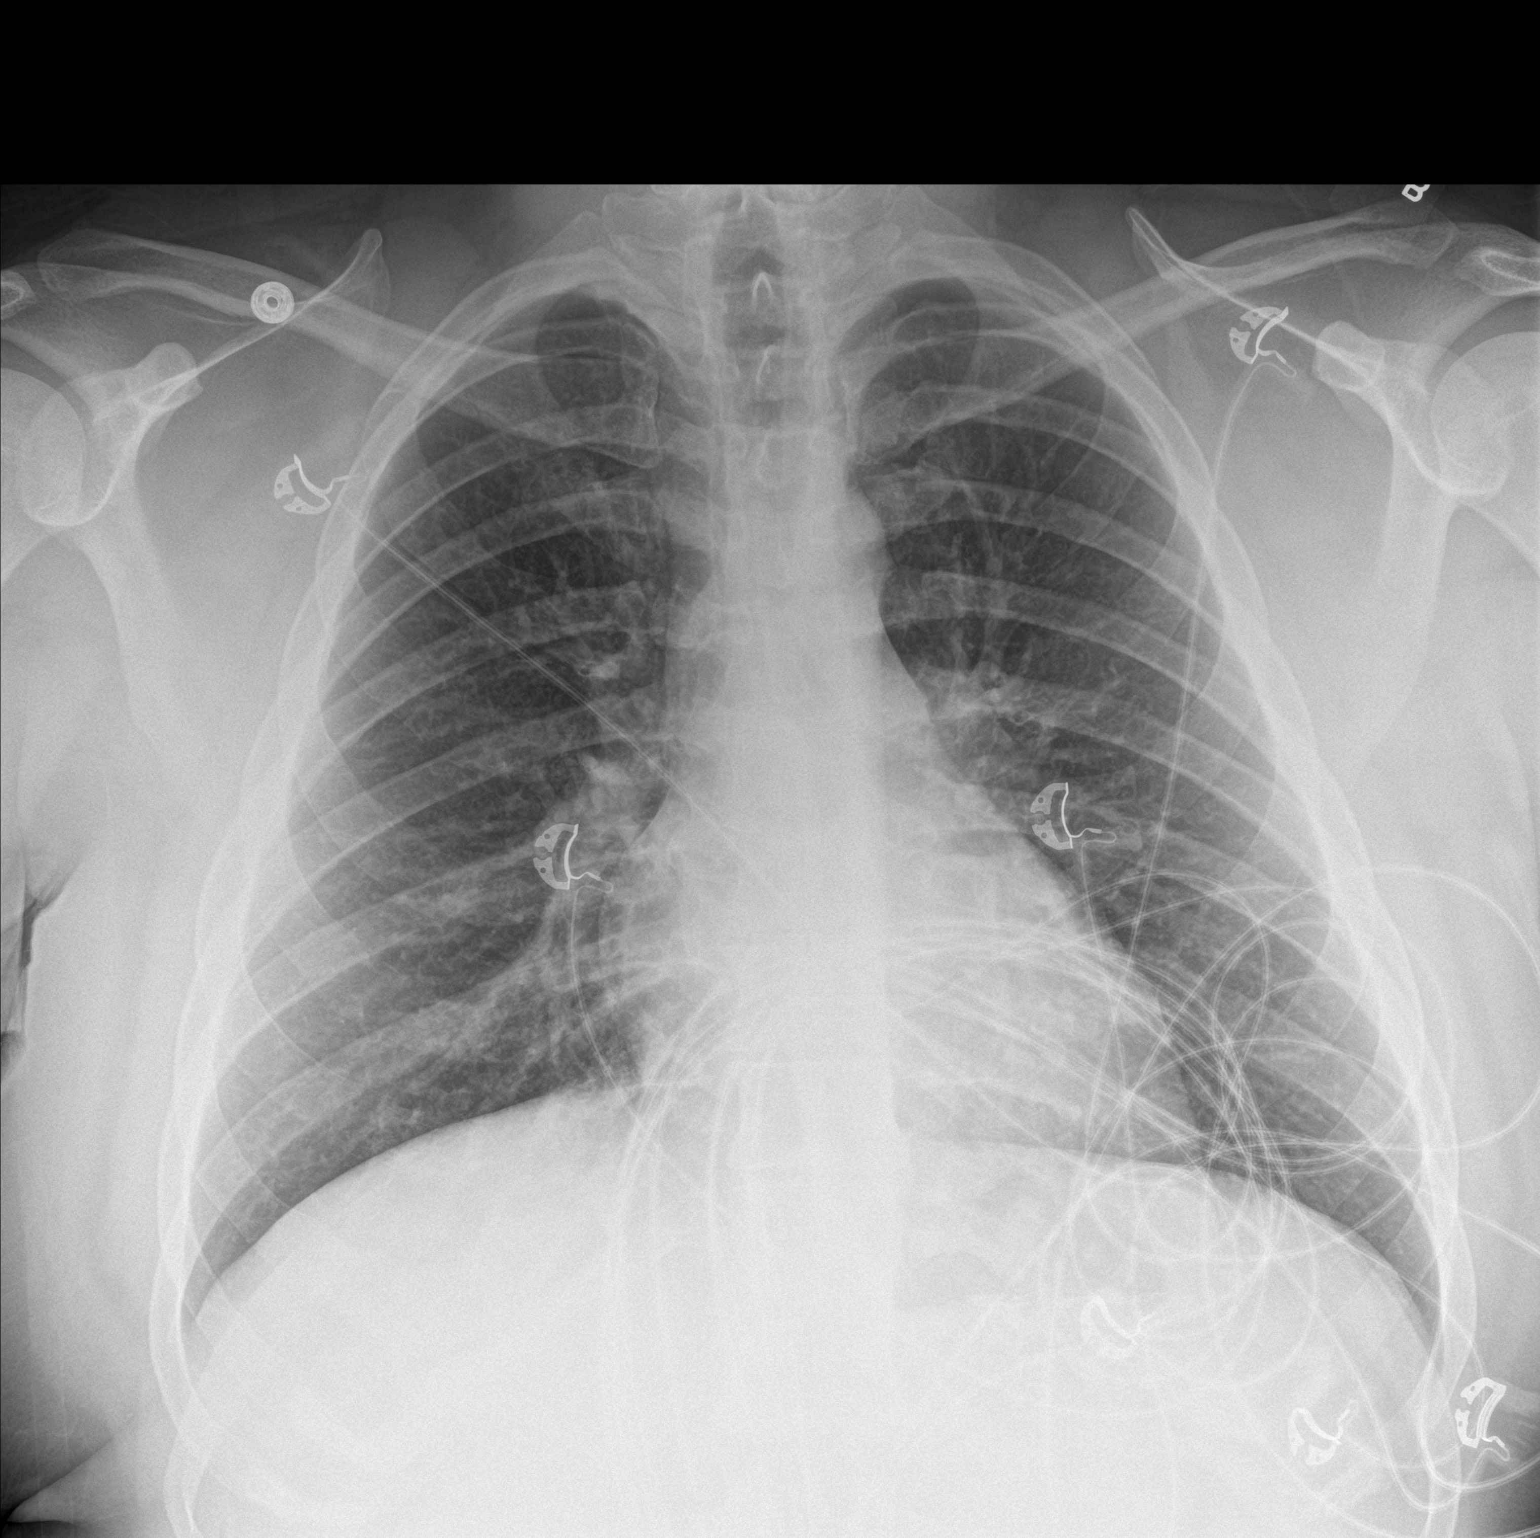

[chest lat]
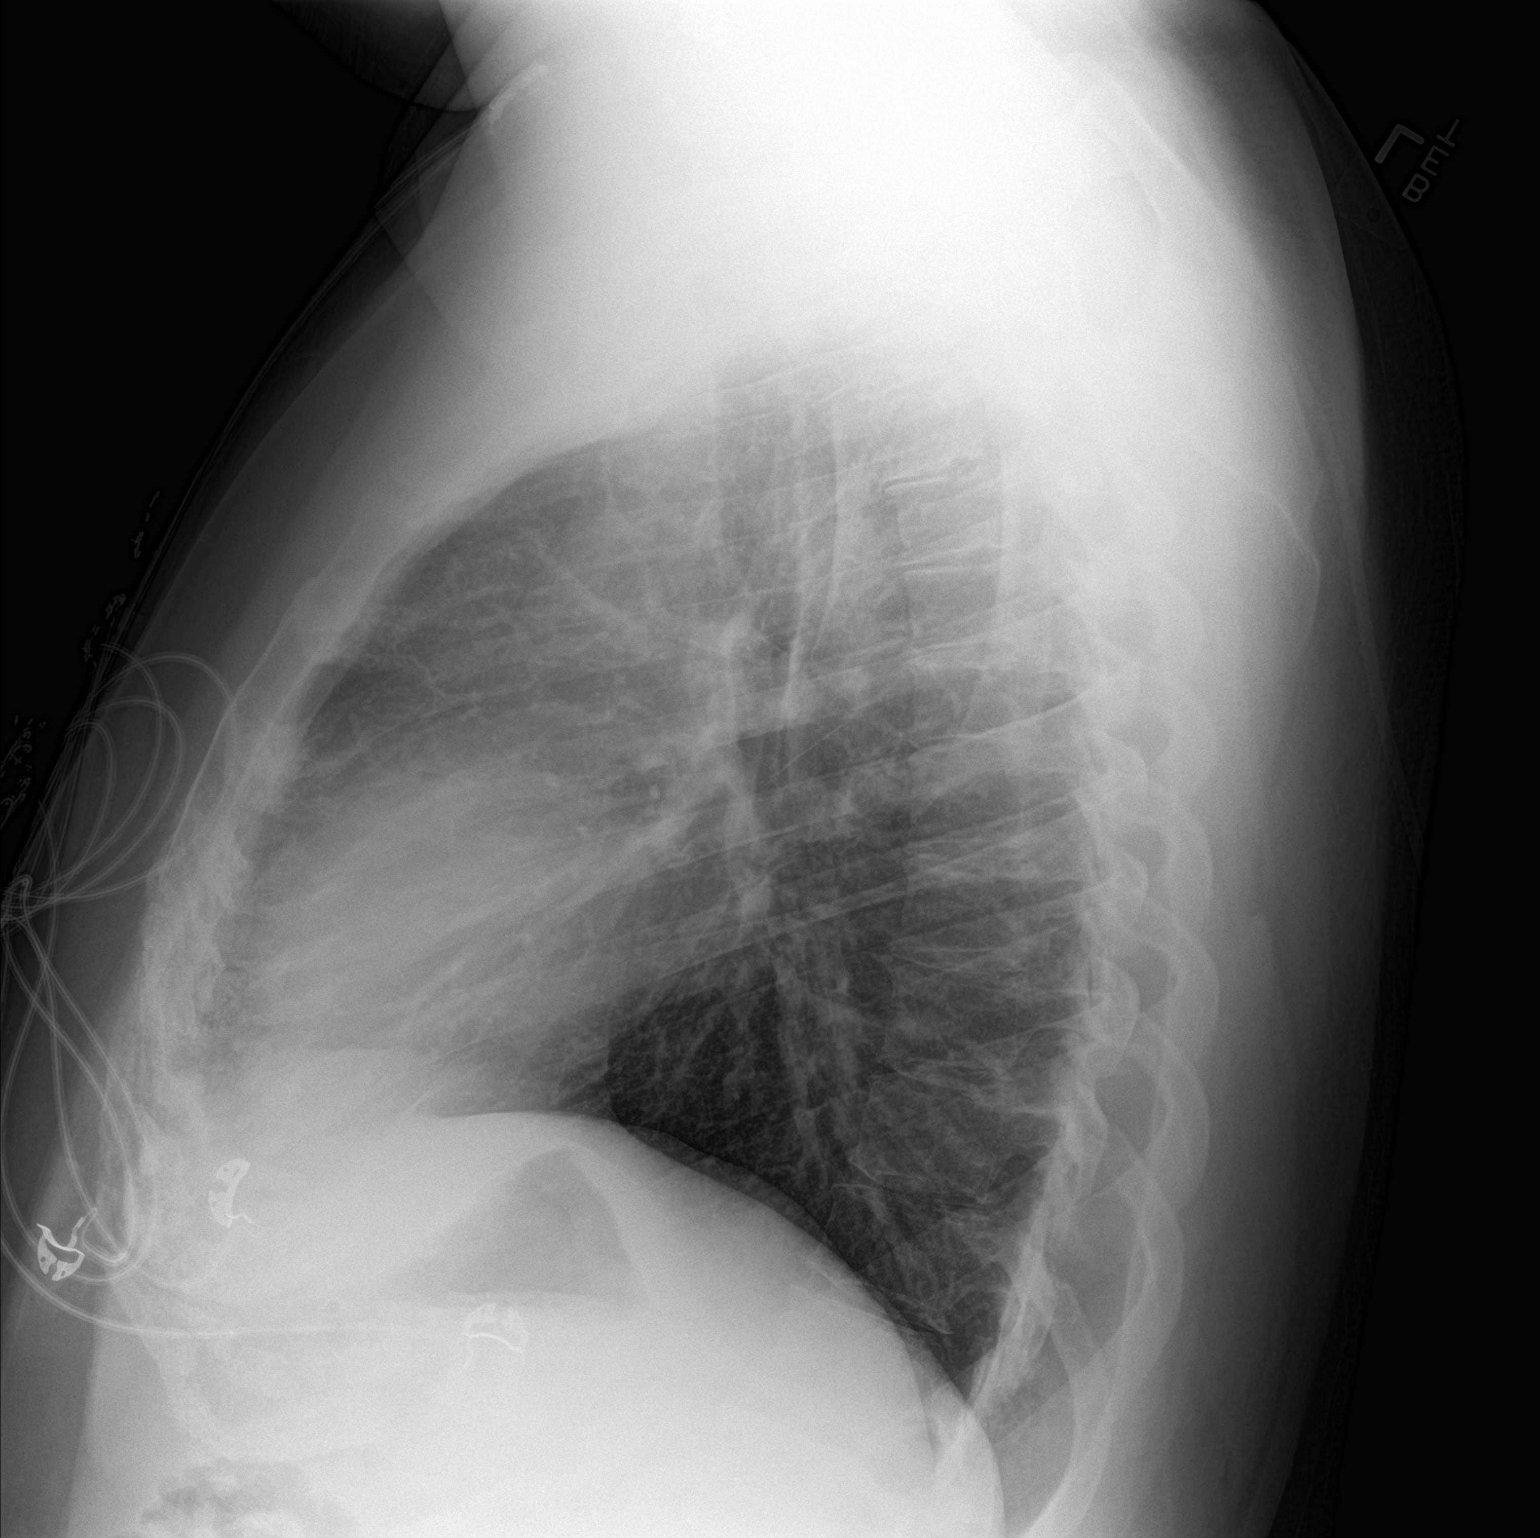

[2 of 2 positions shown; findings below may reference images not displayed]

FINDINGS: EKG leads create artifact over the chest.

Normal heart size and mediastinal contours. No acute infiltrate or
edema. No effusion or pneumothorax. No acute osseous findings.
IMPRESSION: Negative chest.
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
## 2018-07-05 ENCOUNTER — Ambulatory Visit (INDEPENDENT_AMBULATORY_CARE_PROVIDER_SITE_OTHER): Payer: BLUE CROSS/BLUE SHIELD | Admitting: Otolaryngology

## 2018-07-05 DIAGNOSIS — R1312 Dysphagia, oropharyngeal phase: Secondary | ICD-10-CM

## 2018-07-05 DIAGNOSIS — J351 Hypertrophy of tonsils: Secondary | ICD-10-CM | POA: Diagnosis not present

## 2018-07-13 ENCOUNTER — Ambulatory Visit: Payer: BLUE CROSS/BLUE SHIELD | Admitting: Cardiology

## 2018-08-01 ENCOUNTER — Institutional Professional Consult (permissible substitution): Payer: Self-pay | Admitting: Neurology

## 2018-08-01 ENCOUNTER — Telehealth: Payer: Self-pay

## 2018-08-01 NOTE — Telephone Encounter (Signed)
Pt did not show for their appt with Dr. Athar today.  

## 2018-08-02 ENCOUNTER — Encounter: Payer: Self-pay | Admitting: Neurology

## 2018-08-09 ENCOUNTER — Encounter: Payer: Self-pay | Admitting: Cardiology

## 2018-08-09 NOTE — Progress Notes (Signed)
Cardiology Office Note  Date: 08/10/2018   ID: David [name], DOB Nov 04, 1990, MRN [id number]  PCP: Wilson Singer, MD  Consulting Cardiologist: Nona Dell, MD   Chief Complaint  Patient presents with  . Chest Pain    History of Present Illness: David [name] is a 28 y.o. male referred for cardiology consultation by Dr. Karilyn Cota for the evaluation of chest pain.  He canceled his initial visit scheduled back in January.  He is here today with significant other.  He tells me that he has had sporadic episodes of chest discomfort over the last few years.  He describes pain generally in the central portion of his chest, sharp and stabbing, also associated with shortness of breath.  There is no obvious precipitant and symptoms can last for 3 or 4 days.  He does not recall any specific way to alleviate the symptoms, and reports that they generally resolve gradually.  Otherwise, he does not report any recurring exertional chest discomfort and has NYHA class II dyspnea at baseline.  He reports a history of hypertension for the last few years, takes lisinopril.  States that sometimes blood pressure does increase when he has headaches.  He follows with Dr. Karilyn Cota.  We discussed his family history, he tells me that his mother had multiple medical problems and died in her 68s with a heart attack.  She also had a "hole in her heart" as well as his younger brother having a "hole in his heart."  Nobody with obvious unexplained sudden cardiac death or arrhythmias.  I personally reviewed his ECG today which shows sinus rhythm with R' in lead V1 and V2.  He has not undergone any previous cardiac structural or ischemic testing.  He works at [employer].  He is on his feet most of the time, works the evening shift.  Past Medical History:  Diagnosis Date  . Anemia   . Asthma   . Enlargement of tonsils   . Hypertension     Past Surgical History:  Procedure Laterality Date    . LEG SURGERY Right     Current Outpatient Medications  Medication Sig Dispense Refill  . ibuprofen (ADVIL,MOTRIN) 800 MG tablet Take 800 mg by mouth as needed.    Marland Kitchen lisinopril (PRINIVIL,ZESTRIL) 20 MG tablet Take 20 mg by mouth daily.     No current facility-administered medications for this visit.    Allergies:  Amoxicillin-pot clavulanate; Passion fruit flavor; and Latex   Social History: The patient  reports that he quit smoking about 14 years ago. He has never used smokeless tobacco. He reports that he does not drink alcohol or use drugs.   Family History: The patient's family history includes COPD in his mother; Cancer in his mother.   ROS:  Please see the history of present illness. Otherwise, complete review of systems is positive for none.  All other systems are reviewed and negative.   Physical Exam: VS:  BP 110/69 (Cuff Size: Large)   Pulse 74   Ht 5\' 11"  (1.803 m)   Wt (!) 306 lb 3.2 oz (138.9 kg)   SpO2 98%   BMI 42.71 kg/m , BMI Body mass index is 42.71 kg/m.  Wt Readings from Last 3 Encounters:  08/10/18 (!) 306 lb 3.2 oz (138.9 kg)  09/17/16 280 lb (127 kg)  12/09/15 285 lb (129.3 kg)    General: Morbidly obese male, appears comfortable at rest. HEENT: Conjunctiva and lids normal, oropharynx clear. Neck:  Supple, no elevated JVP or carotid bruits, no thyromegaly. Lungs: Clear to auscultation, nonlabored breathing at rest. Cardiac: Regular rate and rhythm, no S3 or significant systolic murmur, no pericardial rub. Abdomen: Obese, nontender, bowel sounds present. Extremities: No pitting edema, distal pulses 2+. Skin: Warm and dry. Musculoskeletal: No kyphosis. Neuropsychiatric: Alert and oriented x3, affect grossly appropriate.  ECG: I personally reviewed the tracing from 12/09/2015 which showed normal sinus rhythm.  Other Studies Reviewed Today:  Chest x-ray 09/17/2016: FINDINGS: The heart size and mediastinal contours are within normal limits. Both  lungs are clear. The visualized skeletal structures are unremarkable.  IMPRESSION: No active cardiopulmonary disease.  Assessment and Plan:  1.  History of recurring atypical chest pain in a 28 year old male with morbid obesity, essential hypertension, and family history of premature CAD in his mother. There is also mention of possible congenital heart disease in his mother and younger brother with "hole in the heart."  Cardiac examination is largely unremarkable as his ECG.  He has not undergone any prior cardiac structural or ischemic testing.  Symptoms are not particularly consistent with ischemic etiology, I wonder whether he has had episodic pericarditis, although certainly cannot confirm that at this time.  I have recommended that he proceed with an echocardiogram to formally assess cardiac structure and function.  We will also obtain a cardiac CTA to define coronary anatomy and exclude any congenital abnormalities.  2.  Essential hypertension, on lisinopril.  Blood pressure is well controlled today.  3.  Morbid obesity.  Current medicines were reviewed with the patient today.   Orders Placed This Encounter  Procedures  . CT CORONARY MORPH W/CTA COR W/SCORE W/CA W/CM &/OR WO/CM  . CT CORONARY FRACTIONAL FLOW RESERVE DATA PREP  . CT CORONARY FRACTIONAL FLOW RESERVE FLUID ANALYSIS  . EKG 12-Lead  . ECHOCARDIOGRAM COMPLETE    Disposition: Call with test results.  Signed, Jonelle Sidle, MD, [facility] 08/10/2018 11:03 AM    [facility] at [facility] [address] [place], [place], [place] [address] Phone: [phone]; Fax: [phone]

## 2018-08-10 ENCOUNTER — Encounter: Payer: Self-pay | Admitting: Cardiology

## 2018-08-10 ENCOUNTER — Telehealth: Payer: Self-pay | Admitting: Cardiology

## 2018-08-10 ENCOUNTER — Ambulatory Visit: Payer: BLUE CROSS/BLUE SHIELD | Admitting: Cardiology

## 2018-08-10 VITALS — BP 110/69 | HR 74 | Ht 71.0 in | Wt 306.2 lb

## 2018-08-10 DIAGNOSIS — Z8249 Family history of ischemic heart disease and other diseases of the circulatory system: Secondary | ICD-10-CM

## 2018-08-10 DIAGNOSIS — Z87898 Personal history of other specified conditions: Secondary | ICD-10-CM

## 2018-08-10 DIAGNOSIS — I10 Essential (primary) hypertension: Secondary | ICD-10-CM | POA: Diagnosis not present

## 2018-08-10 DIAGNOSIS — R079 Chest pain, unspecified: Secondary | ICD-10-CM

## 2018-08-10 MED ORDER — METOPROLOL TARTRATE 50 MG PO TABS: 50.0000 mg | ORAL_TABLET | Freq: Once | ORAL | 0 refills | Status: DC

## 2018-08-10 NOTE — Patient Instructions (Addendum)
Medication Instructions:   Your physician recommends that you continue on your current medications as directed. Please refer to the Current Medication list given to you today.  Labwork:  Your physician recommends that you return for lab work in: BMET for your cardiac CT-must be done within 6 weeks of your test  Testing/Procedures: Your physician has requested that you have an echocardiogram. Echocardiography is a painless test that uses sound waves to create images of your heart. It provides your doctor with information about the size and shape of your heart and how well your heart's chambers and valves are working. This procedure takes approximately one hour. There are no restrictions for this procedure. Your physician has requested that you have cardiac CT. Cardiac computed tomography (CT) is a painless test that uses an x-ray machine to take clear, detailed pictures of your heart. For further information please visit [email or web address] Please follow instruction sheet as given.  Follow-Up:  Your physician recommends that you schedule a follow-up appointment in: pending test results.  Any Other Special Instructions Will Be Listed Below (If Applicable).  Please arrive at the [facility] main entrance of [facility] at xx:xx AM (30-45 minutes prior to test start time)-You will be contacted once this is scheduled.  [facility] [address] [place], [place] [address] [phone]  Proceed to the [facility] Radiology Department (First Floor).  Please follow these instructions carefully (unless otherwise directed):  Hold all erectile dysfunction medications at least 48 hours prior to test.  On the Night Before the Test: Drink plenty of water. Do not consume any caffeinated/decaffeinated beverages or chocolate 12 hours prior to your test. Do not take any antihistamines 12 hours prior to your test.   On the Day of the Test: Drink plenty of water. Do not drink  any water within one hour of the test. Do not eat any food 4 hours prior to the test. You may take your regular medications prior to the test. IF NOT ON A BETA BLOCKER - Take 50 mg of lopressor (metoprolol) one hour before the test. HOLD Furosemide morning of the test.  After the Test: Drink plenty of water. After receiving IV contrast, you may experience a mild flushed feeling. This is normal. On occasion, you may experience a mild rash up to 24 hours after the test. This is not dangerous. If this occurs, you can take Benadryl 25 mg and increase your fluid intake. If you experience trouble breathing, this can be serious. If it is severe call 911 IMMEDIATELY. If it is mild, please call our office. If you take any of these medications: Glipizide/Metformin, Avandament, Glucavance, please do not take 48 hours after completing test.    If you need a refill on your cardiac medications before your next appointment, please call your pharmacy.

## 2018-08-10 NOTE — Telephone Encounter (Signed)
°  Precert needed for:  2 D Echo dx: recurrent chest pain & family history of premature CAD   Location: [facility]     Date:  September 06, 2018

## 2018-08-22 ENCOUNTER — Telehealth: Payer: Self-pay | Admitting: Cardiology

## 2018-08-22 ENCOUNTER — Other Ambulatory Visit: Payer: Self-pay | Admitting: Cardiology

## 2018-08-22 DIAGNOSIS — Z8249 Family history of ischemic heart disease and other diseases of the circulatory system: Secondary | ICD-10-CM

## 2018-08-22 DIAGNOSIS — R079 Chest pain, unspecified: Secondary | ICD-10-CM

## 2018-08-22 NOTE — Telephone Encounter (Signed)
patient called stating that he has not received  Pill he is to take before test.

## 2018-08-22 NOTE — Telephone Encounter (Signed)
Patient notified via detailed voice message - Lopressor 50mg  tab sent to [facility] on 08/10/2018.  Please call pharmacy to get ready for you, may have put back due to lapse of time.  Request call back to office if have any issues getting the medication.

## 2018-08-30 ENCOUNTER — Other Ambulatory Visit (HOSPITAL_COMMUNITY): Admission: RE | Admit: 2018-08-30 | Payer: BLUE CROSS/BLUE SHIELD | Source: Ambulatory Visit

## 2018-09-03 ENCOUNTER — Other Ambulatory Visit (HOSPITAL_COMMUNITY)
Admission: RE | Admit: 2018-09-03 | Discharge: 2018-09-03 | Disposition: A | Discharge status: Home / Self Care | Payer: BLUE CROSS/BLUE SHIELD | Source: Ambulatory Visit | Attending: Cardiology | Admitting: Cardiology

## 2018-09-03 DIAGNOSIS — I10 Essential (primary) hypertension: Secondary | ICD-10-CM | POA: Insufficient documentation

## 2018-09-03 LAB — BASIC METABOLIC PANEL
Anion gap: 8 (ref 5–15)
BUN: 12 mg/dL (ref 6–20)
CO2: 27 mmol/L (ref 22–32)
Calcium: 9.2 mg/dL (ref 8.9–10.3)
Chloride: 102 mmol/L (ref 98–111)
Creatinine, Ser: 0.92 mg/dL (ref 0.61–1.24)
GFR calc Af Amer: >60 mL/min (ref >60)
GFR calc non Af Amer: >60 mL/min (ref >60)
Glucose, Bld: 94 mg/dL (ref 70–99)
Potassium: 4 mmol/L (ref 3.5–5.1)
Sodium: 137 mmol/L (ref 135–145)

## 2018-09-04 ENCOUNTER — Telehealth (HOSPITAL_COMMUNITY): Payer: Self-pay | Admitting: Emergency Medicine

## 2018-09-04 NOTE — Telephone Encounter (Signed)
Left message on voicemail with name and callback number Margareth Kanner RN Navigator Cardiac Imaging [facility] [phone] Office [phone] Cell  

## 2018-09-05 ENCOUNTER — Telehealth: Payer: Self-pay | Admitting: *Deleted

## 2018-09-05 NOTE — Telephone Encounter (Signed)
-----   Message from Ellsworth Lennox, [place] sent at 09/03/2018  1:29 PM EDT ----- Covering for Dr. Diona Browner - Please let the patient know his electrolytes and renal function are within normal limits. Continue with plans for Coronary CT.

## 2018-09-05 NOTE — Telephone Encounter (Signed)
Patient informed. Copy sent to PCP °

## 2018-09-06 ENCOUNTER — Ambulatory Visit (HOSPITAL_COMMUNITY): Admission: RE | Admit: 2018-09-06 | Payer: BLUE CROSS/BLUE SHIELD | Source: Ambulatory Visit

## 2018-09-06 ENCOUNTER — Other Ambulatory Visit: Payer: Self-pay

## 2018-09-06 ENCOUNTER — Other Ambulatory Visit: Payer: BLUE CROSS/BLUE SHIELD

## 2018-09-07 ENCOUNTER — Encounter (INDEPENDENT_AMBULATORY_CARE_PROVIDER_SITE_OTHER): Payer: Self-pay | Admitting: Internal Medicine

## 2018-09-11 ENCOUNTER — Telehealth: Payer: Self-pay | Admitting: Cardiology

## 2018-09-11 NOTE — Telephone Encounter (Signed)
°  Precert needed for: Echo   ° ° °Location: [facility]  °  ° °Date:  October 17, 2018 °

## 2018-09-12 ENCOUNTER — Other Ambulatory Visit: Payer: BLUE CROSS/BLUE SHIELD

## 2018-10-17 ENCOUNTER — Other Ambulatory Visit: Payer: BLUE CROSS/BLUE SHIELD

## 2018-10-17 ENCOUNTER — Telehealth: Payer: Self-pay

## 2018-10-17 NOTE — Telephone Encounter (Signed)

## 2018-10-18 ENCOUNTER — Other Ambulatory Visit: Payer: BLUE CROSS/BLUE SHIELD

## 2018-11-19 ENCOUNTER — Other Ambulatory Visit: Payer: Self-pay | Admitting: *Deleted

## 2018-11-19 DIAGNOSIS — Z8249 Family history of ischemic heart disease and other diseases of the circulatory system: Secondary | ICD-10-CM

## 2018-11-19 DIAGNOSIS — I10 Essential (primary) hypertension: Secondary | ICD-10-CM

## 2018-11-21 ENCOUNTER — Telehealth: Payer: Self-pay | Admitting: Cardiology

## 2018-11-21 NOTE — Telephone Encounter (Signed)

## 2018-11-22 ENCOUNTER — Telehealth: Payer: Self-pay | Admitting: *Deleted

## 2018-11-22 ENCOUNTER — Ambulatory Visit (INDEPENDENT_AMBULATORY_CARE_PROVIDER_SITE_OTHER): Payer: BC Managed Care – PPO

## 2018-11-22 DIAGNOSIS — R079 Chest pain, unspecified: Secondary | ICD-10-CM | POA: Diagnosis not present

## 2018-11-22 DIAGNOSIS — Z8249 Family history of ischemic heart disease and other diseases of the circulatory system: Secondary | ICD-10-CM | POA: Diagnosis not present

## 2018-11-22 NOTE — Telephone Encounter (Signed)
Patient informed. Copy sent to PCP °

## 2018-11-22 NOTE — Telephone Encounter (Signed)
-----   Message from Jonelle Sidle, MD sent at 11/22/2018  4:21 PM EDT ----- Results reviewed.  Echocardiogram shows normal LVEF greater than 65%, no regional wall motion abnormalities.  No obvious PFO or ASD and no major valvular abnormalities. A copy of this test should be forwarded to Wilson Singer, MD.

## 2018-12-12 ENCOUNTER — Ambulatory Visit (HOSPITAL_COMMUNITY): Payer: BC Managed Care – PPO

## 2018-12-26 ENCOUNTER — Telehealth: Payer: Self-pay | Admitting: *Deleted

## 2018-12-28 ENCOUNTER — Other Ambulatory Visit: Payer: Self-pay | Admitting: *Deleted

## 2018-12-28 DIAGNOSIS — I10 Essential (primary) hypertension: Secondary | ICD-10-CM

## 2018-12-28 MED ORDER — METOPROLOL TARTRATE 50 MG PO TABS: 50.0000 mg | ORAL_TABLET | Freq: Once | ORAL | 0 refills | Status: DC

## 2018-12-28 NOTE — Telephone Encounter (Signed)
Reports picking up lopressor 50 mg in February and has misplaced it. Need new prescription sent to pharmacy. Advised that refill sent to [facility].

## 2018-12-28 NOTE — Telephone Encounter (Signed)
Patient aware to have lab work today after work.

## 2018-12-28 NOTE — Telephone Encounter (Signed)
Contacted because bmet needed for cardiac ct hasn't been done yet. Patient says he is working until 6 pm today and can go after work. New order placed for stat bmet to be done at [facility].

## 2018-12-31 ENCOUNTER — Ambulatory Visit (HOSPITAL_COMMUNITY): Payer: BC Managed Care – PPO | Attending: Cardiology

## 2018-12-31 ENCOUNTER — Ambulatory Visit (HOSPITAL_COMMUNITY): Admission: RE | Admit: 2018-12-31 | Payer: BC Managed Care – PPO | Source: Ambulatory Visit

## 2019-01-04 ENCOUNTER — Encounter: Payer: Self-pay | Admitting: Cardiology

## 2019-02-28 ENCOUNTER — Telehealth (HOSPITAL_COMMUNITY): Payer: Self-pay | Admitting: Emergency Medicine

## 2019-02-28 NOTE — Telephone Encounter (Signed)
Reaching out to patient to offer assistance regarding upcoming cardiac imaging study; pt verbalizes understanding of appt date/time, parking situation and where to check in, pre-test NPO status and medications ordered, and verified current allergies; name and call back number provided for further questions should they arise Atheena Spano RN Navigator Cardiac Imaging [facility] [phone] office [phone] cell 

## 2019-03-04 ENCOUNTER — Ambulatory Visit (HOSPITAL_COMMUNITY)
Admission: RE | Admit: 2019-03-04 | Discharge: 2019-03-04 | Disposition: A | Discharge status: Home / Self Care | Payer: BC Managed Care – PPO | Source: Ambulatory Visit | Attending: Cardiology | Admitting: Cardiology

## 2019-03-04 ENCOUNTER — Encounter: Payer: BC Managed Care – PPO | Admitting: *Deleted

## 2019-03-04 ENCOUNTER — Ambulatory Visit (HOSPITAL_COMMUNITY): Admission: RE | Admit: 2019-03-04 | Payer: BC Managed Care – PPO | Source: Ambulatory Visit

## 2019-03-04 ENCOUNTER — Other Ambulatory Visit: Payer: Self-pay

## 2019-03-04 DIAGNOSIS — Z8249 Family history of ischemic heart disease and other diseases of the circulatory system: Secondary | ICD-10-CM

## 2019-03-04 DIAGNOSIS — R079 Chest pain, unspecified: Secondary | ICD-10-CM

## 2019-03-04 DIAGNOSIS — Z006 Encounter for examination for normal comparison and control in clinical research program: Secondary | ICD-10-CM

## 2019-03-04 MED ORDER — NITROGLYCERIN 0.4 MG SL SUBL
0.8000 mg | SUBLINGUAL_TABLET | Freq: Once | SUBLINGUAL | Status: AC
  Administered 2019-03-04: 0.8 mg via SUBLINGUAL
  Filled 2019-03-04: qty 25

## 2019-03-04 MED ORDER — IOHEXOL 350 MG/ML SOLN
80.0000 mL | Freq: Once | INTRAVENOUS | Status: AC | PRN
  Administered 2019-03-04: 80 mL via INTRAVENOUS

## 2019-03-04 MED ORDER — NITROGLYCERIN 0.4 MG SL SUBL
SUBLINGUAL_TABLET | SUBLINGUAL | Status: AC
  Filled 2019-03-04: qty 2

## 2019-03-04 NOTE — Research (Signed)
CADFEM Informed Consent                  Subject Name:   David [name]   Subject met inclusion and exclusion criteria.  The informed consent form, study requirements and expectations were reviewed with the subject and questions and concerns were addressed prior to the signing of the consent form.  The subject verbalized understanding of the trial requirements.  The subject agreed to participate in the CADFEM trial and signed the informed consent.  The informed consent was obtained prior to performance of any protocol-specific procedures for the subject.  A copy of the signed informed consent was given to the subject and a copy was placed in the subject's medical record.   [place] Sriyan Cutting, Research Assistant   03-04-2019 07:22 a.m.

## 2019-03-05 ENCOUNTER — Telehealth: Payer: Self-pay | Admitting: *Deleted

## 2019-03-05 NOTE — Telephone Encounter (Signed)
Patient informed. Copy sent to PCP °

## 2019-03-05 NOTE — Telephone Encounter (Signed)
-----   Message from Satira Sark, MD sent at 03/04/2019  9:48 AM EDT ----- Results reviewed.  This is the radiology overread, no acute findings.  Await cardiac results.

## 2019-03-05 NOTE — Telephone Encounter (Signed)
-----   Message from Satira Sark, MD sent at 03/05/2019 10:06 AM EDT ----- Results reviewed.  Please let him know that the cardiac CT did not demonstrate any significant CAD, calcium score was 0 suggesting low risk for cardiac events.  Also no clear evidence of congenital cardiac defects.  No further cardiac testing is planned, he should keep follow-up with PCP.

## 2019-07-24 ENCOUNTER — Ambulatory Visit: Payer: 59 | Attending: Internal Medicine

## 2019-07-24 ENCOUNTER — Other Ambulatory Visit: Payer: Self-pay

## 2019-07-24 DIAGNOSIS — Z20822 Contact with and (suspected) exposure to covid-19: Secondary | ICD-10-CM

## 2019-07-26 LAB — NOVEL CORONAVIRUS, NAA: SARS-CoV-2, NAA: NOT DETECTED

## 2019-07-29 ENCOUNTER — Encounter (INDEPENDENT_AMBULATORY_CARE_PROVIDER_SITE_OTHER): Payer: Self-pay | Admitting: Internal Medicine

## 2019-07-29 ENCOUNTER — Other Ambulatory Visit: Payer: Self-pay

## 2019-07-29 ENCOUNTER — Ambulatory Visit (INDEPENDENT_AMBULATORY_CARE_PROVIDER_SITE_OTHER): Payer: 59 | Admitting: Internal Medicine

## 2019-07-29 ENCOUNTER — Other Ambulatory Visit (INDEPENDENT_AMBULATORY_CARE_PROVIDER_SITE_OTHER): Payer: Self-pay | Admitting: Internal Medicine

## 2019-07-29 VITALS — BP 140/82 | HR 89 | Temp 99.4°F | Resp 18 | Ht 71.0 in | Wt 315.4 lb

## 2019-07-29 DIAGNOSIS — I10 Essential (primary) hypertension: Secondary | ICD-10-CM

## 2019-07-29 NOTE — Progress Notes (Signed)
Metrics: Intervention Frequency ACO  Documented Smoking Status Yearly  Screened one or more times in 24 months  Cessation Counseling or  Active cessation medication Past 24 months  Past 24 months   Guideline developer: UpToDate (See UpToDate for funding source) Date Released: 2014       Wellness Office Visit  Subjective:  Patient ID: David [name], male    DOB: [birth year]  Age: 29 y.o. MRN: [id number]  CC: This man comes in for follow-up of hypertension and morbid obesity. HPI  I have not seen him for more than 6 months.  He has been compliant with his medications.  He denies any chest pain, dyspnea or palpitations.  He has nearly not lost any significant weight since the last time he was seen. Past Medical History:  Diagnosis Date  . Anemia   . Asthma   . Enlargement of tonsils   . Hypertension       Family History  Problem Relation Age of Onset  . Cancer Mother   . COPD Mother     Social History   Social History Narrative   Married for 5 years.Lives with wife and daughter.Works at [employer].   Social History   Tobacco Use  . Smoking status: Former Smoker    Quit date: 06/20/2004    Years since quitting: 15.1  . Smokeless tobacco: Never Used  . Tobacco comment: only smoked for about a year   Substance Use Topics  . Alcohol use: No    Current Meds  Medication Sig  . lisinopril (PRINIVIL,ZESTRIL) 20 MG tablet Take 20 mg by mouth daily.         Objective:   Today's Vitals: BP 140/82 (BP Location: Right Arm, Patient Position: Sitting, Cuff Size: Normal)   Pulse 89   Temp 99.4 F (37.4 C) (Temporal)   Resp 18   Ht 5\' 11"  (1.803 m)   Wt (!) 315 lb 6.4 oz (143.1 kg)   SpO2 98% Comment: wearing mask.  BMI 43.99 kg/m  Vitals with BMI 07/29/2019 03/04/2019 03/04/2019  Height 5\' 11"  - -  Weight 315 lbs 6 oz - -  BMI 44.01 - -  Systolic 140 129 03/06/2019  Diastolic 82 78 61  Pulse 89 - -     Physical Exam  He looks systemically well.  Blood pressure  elevated today but he has not taken his medication this morning.  He is alert and orientated without any focal neurological signs.     Assessment   1. HYPERTENSION, BENIGN ESSENTIAL   2. Morbid obesity (HCC)       Tests ordered Orders Placed This Encounter  Procedures  . COMPLETE METABOLIC PANEL WITH GFR     Plan: 1. Blood work is ordered as above. 2. He will continue with lisinopril for his hypertension. 3. Further recommendations will depend on blood results and I will have him follow-up with Sarah in 3 months time.   No orders of the defined types were placed in this encounter.   , MD

## 2019-07-30 LAB — COMPLETE METABOLIC PANEL WITH GFR
AG Ratio: 1.3 (calc) (ref 1.0–2.5)
ALT: 22 U/L (ref 9–46)
AST: 17 U/L (ref 10–40)
Albumin: 4 g/dL (ref 3.6–5.1)
Alkaline phosphatase (APISO): 79 U/L (ref 36–130)
BUN: 8 mg/dL (ref 7–25)
CO2: 25 mmol/L (ref 20–32)
Calcium: 9.2 mg/dL (ref 8.6–10.3)
Chloride: 103 mmol/L (ref 98–110)
Creat: 0.81 mg/dL (ref 0.60–1.35)
GFR, Est African American: 139 mL/min/{1.73_m2} (ref >=60)
GFR, Est Non African American: 120 mL/min/{1.73_m2} (ref >=60)
Globulin: 3 g/dL (calc) (ref 1.9–3.7)
Glucose, Bld: 83 mg/dL (ref 65–99)
Potassium: 4.5 mmol/L (ref 3.5–5.3)
Sodium: 138 mmol/L (ref 135–146)
Total Bilirubin: 0.3 mg/dL (ref 0.2–1.2)
Total Protein: 7 g/dL (ref 6.1–8.1)

## 2019-09-26 ENCOUNTER — Encounter (INDEPENDENT_AMBULATORY_CARE_PROVIDER_SITE_OTHER): Payer: Self-pay

## 2019-10-28 ENCOUNTER — Other Ambulatory Visit: Payer: Self-pay

## 2019-10-28 ENCOUNTER — Ambulatory Visit (INDEPENDENT_AMBULATORY_CARE_PROVIDER_SITE_OTHER): Payer: 59 | Admitting: Nurse Practitioner

## 2019-10-28 ENCOUNTER — Encounter (INDEPENDENT_AMBULATORY_CARE_PROVIDER_SITE_OTHER): Payer: Self-pay | Admitting: Nurse Practitioner

## 2019-10-28 VITALS — BP 130/90 | HR 69 | Temp 98.1°F | Ht 71.0 in | Wt 312.0 lb

## 2019-10-28 DIAGNOSIS — I10 Essential (primary) hypertension: Secondary | ICD-10-CM | POA: Diagnosis not present

## 2019-10-28 DIAGNOSIS — J45990 Exercise induced bronchospasm: Secondary | ICD-10-CM | POA: Diagnosis not present

## 2019-10-28 NOTE — Progress Notes (Signed)
Subjective:  Patient ID: David [name], male    DOB: 08/11/1990  Age: 29 y.o. MRN: [id number]  CC:  Chief Complaint  Patient presents with  . Hypertension  . Follow-up      HPI  This patient arrives today for follow-up regarding his chronic conditions.  Hypertension: He continues on his lisinopril 20 mg daily.  Last metabolic panel was collected in February 2021 and was normal.  He has been tolerating his medication well.  Asthma: I see that he has a history of asthma, but he tells me he is not been treated for this in many years.  He tells me he was diagnosed in middle school or high school, and used to use an inhaler to help with exercise.  However, now he does not feel that his breathing worsens with physical activity.  Obesity: Does have a history of obesity.  He has lost approximately 3 pounds since his last office visit.   Past Medical History:  Diagnosis Date  . Anemia   . Asthma   . Enlargement of tonsils   . Hypertension       Family History  Problem Relation Age of Onset  . Cancer Mother   . COPD Mother     Social History   Social History Narrative   Married for 5 years.Lives with wife and daughter.Works at [employer].   Social History   Tobacco Use  . Smoking status: Former Smoker    Quit date: 06/20/2004    Years since quitting: 15.3  . Smokeless tobacco: Never Used  . Tobacco comment: only smoked for about a year   Substance Use Topics  . Alcohol use: No     Current Meds  Medication Sig  . lisinopril (PRINIVIL,ZESTRIL) 20 MG tablet Take 20 mg by mouth daily.    ROS:  Review of Systems  Constitutional: Negative for fever and weight loss.  Eyes: Negative for blurred vision and double vision.  Respiratory: Negative for cough, shortness of breath and wheezing.   Cardiovascular: Negative for chest pain.  Neurological: Negative for dizziness and headaches.     Objective:   Today's Vitals: BP 130/90 (BP Location: Right Arm,  Patient Position: Sitting, Cuff Size: Normal)   Pulse 69   Temp 98.1 F (36.7 C) (Temporal)   Ht 5\' 11"  (1.803 m)   Wt (!) 312 lb (141.5 kg)   SpO2 98%   BMI 43.52 kg/m  Vitals with BMI 10/28/2019 07/29/2019 03/04/2019  Height 5\' 11"  5\' 11"  -  Weight 312 lbs 315 lbs 6 oz -  BMI 43.53 44.01 -  Systolic 130 140 03/06/2019  Diastolic 90 82 78  Pulse 69 89 -     Physical Exam Vitals reviewed.  Constitutional:      Appearance: Normal appearance.  HENT:     Head: Normocephalic and atraumatic.  Cardiovascular:     Rate and Rhythm: Normal rate and regular rhythm.  Pulmonary:     Effort: Pulmonary effort is normal.     Breath sounds: Normal breath sounds.  Musculoskeletal:     Cervical back: Neck supple.  Skin:    General: Skin is warm and dry.  Neurological:     Mental Status: He is alert and oriented to person, place, and time.  Psychiatric:        Mood and Affect: Mood normal.        Behavior: Behavior normal.        Thought Content: Thought content normal.  Judgment: Judgment normal.          Assessment and Plan   1. HYPERTENSION, BENIGN ESSENTIAL   2. Morbid obesity ([place])   3. ASTHMA, EXERCISE INDUCED      Plan: 1.  He will continue on his current medication regimen as currently prescribed.  He tells me he did not take his medication prior to coming to this office today, I told him to take this as soon as he leaves the office today.  He tells me he will.  2.  He will continue to try to lose weight focusing on diet and physical activity.  3.  I think this may have resolved, and he may have grown out of it.  I offered to send a prescription for an as needed inhaler, however he does not feel that is necessary at this time.   Tests ordered No orders of the defined types were placed in this encounter.     No orders of the defined types were placed in this encounter.   Patient to follow-up in 3-6 months for annual physical exam with Dr. Anastasio Champion.  Ailene Ards, NP

## 2019-10-29 ENCOUNTER — Other Ambulatory Visit (INDEPENDENT_AMBULATORY_CARE_PROVIDER_SITE_OTHER): Payer: Self-pay | Admitting: Internal Medicine

## 2020-01-30 ENCOUNTER — Ambulatory Visit (INDEPENDENT_AMBULATORY_CARE_PROVIDER_SITE_OTHER): Payer: 59 | Admitting: Internal Medicine

## 2020-01-30 ENCOUNTER — Encounter (INDEPENDENT_AMBULATORY_CARE_PROVIDER_SITE_OTHER): Payer: Self-pay | Admitting: Internal Medicine

## 2020-01-30 ENCOUNTER — Other Ambulatory Visit: Payer: Self-pay

## 2020-01-30 VITALS — BP 120/72 | HR 87 | Temp 97.7°F | Resp 19 | Ht 71.0 in | Wt 306.6 lb

## 2020-01-30 DIAGNOSIS — Z131 Encounter for screening for diabetes mellitus: Secondary | ICD-10-CM | POA: Diagnosis not present

## 2020-01-30 DIAGNOSIS — E559 Vitamin D deficiency, unspecified: Secondary | ICD-10-CM | POA: Diagnosis not present

## 2020-01-30 DIAGNOSIS — I10 Essential (primary) hypertension: Secondary | ICD-10-CM | POA: Diagnosis not present

## 2020-01-30 DIAGNOSIS — Z0001 Encounter for general adult medical examination with abnormal findings: Secondary | ICD-10-CM

## 2020-01-30 NOTE — Progress Notes (Signed)
Chief Complaint: This 29 year old man comes in for an annual physical exam and to address his chronic conditions which are listed below. HPI: His main problem is morbid obesity and he is not really lost significant weight since the time I have known him. He continues on antihypertensive therapy with lisinopril for his hypertension. He denies any chest pain, dyspnea, palpitations or limb weakness. He is doing well at his work at [employer] and [employer], doubling his salary and overall his outlook is good financially.  I do remember a time when he was employed in 3 different jobs throughout the day to make ends meet.  Past Medical History:  Diagnosis Date  . Anemia   . Asthma   . ASTHMA, EXERCISE INDUCED 08/17/2006   Qualifier: Diagnosis of  By: Abundio Miu    . ECZEMA, ATOPIC DERMATITIS 08/17/2006   Qualifier: Diagnosis of  By: Abundio Miu    . Enlargement of tonsils   . Hypertension    Past Surgical History:  Procedure Laterality Date  . LEG SURGERY Right      Social History   Social History Narrative   Married for 5 years.Lives with wife and daughter.Works at [employer].    Social History   Tobacco Use  . Smoking status: Former Smoker    Quit date: 06/20/2004    Years since quitting: 15.6  . Smokeless tobacco: Never Used  . Tobacco comment: only smoked for about a year   Substance Use Topics  . Alcohol use: No      Allergies:  Allergies  Allergen Reactions  . Amoxicillin-Pot Clavulanate Anaphylaxis    Pt reports that he "stops breathing"  . Other Anaphylaxis  . Passion Fruit Flavor Anaphylaxis  . Latex Swelling     Current Meds  Medication Sig  . lisinopril (ZESTRIL) 20 MG tablet Take 1 tablet by mouth once daily      Depression screen [facility] 2/9 10/28/2019  Decreased Interest 0  Down, Depressed, Hopeless 0  PHQ - 2 Score 0     BJS:EGBTD from the symptoms mentioned above,there are no other symptoms referable to all systems  reviewed.       Physical Exam: Blood pressure 120/72, pulse 87, temperature 97.7 F (36.5 [name]), resp. rate 19, height 5\' 11"  (1.803 m), weight (!) 306 lb 9.6 oz (139.1 kg), SpO2 98 %. Vitals with BMI 01/30/2020 10/28/2019 07/29/2019  Height 5\' 11"  5\' 11"  5\' 11"   Weight 306 lbs 10 oz 312 lbs 315 lbs 6 oz  BMI 42.78 43.53 44.01  Systolic 120 130 09/26/2019  Diastolic 72 90 82  Pulse 87 69 89      He looks systemically well but remains morbidly obese.  He has lost 6 pounds since last visit.  Blood pressure is excellent. General: Alert, cooperative, and appears to be the stated age.No pallor.  No jaundice.  No clubbing. Head: Normocephalic Eyes: Sclera white, pupils equal and reactive to light, red reflex x 2,  Ears: Normal bilaterally Oral cavity: Lips, mucosa, and tongue normal: Teeth and gums normal Neck: No adenopathy, supple, symmetrical, trachea midline, and thyroid does not appear enlarged Respiratory: Clear to auscultation bilaterally.No wheezing, crackles or bronchial breathing. Cardiovascular: Heart sounds are present and appear to be normal without murmurs or added sounds.  No carotid bruits.  Peripheral pulses are present and equal bilaterally.: Gastrointestinal:positive bowel sounds, no hepatosplenomegaly.  No masses felt.No tenderness. Skin: Clear, No rashes noted.No worrisome skin lesions seen. Neurological: Grossly intact without focal findings, cranial  nerves II through XII intact, muscle strength equal bilaterally Musculoskeletal: No acute joint abnormalities noted.Full range of movement noted with joints. Psychiatric: Affect appropriate, non-anxious.    Assessment  1. Encounter for general adult medical examination with abnormal findings   2. Morbid obesity (HCC)   3. HYPERTENSION, BENIGN ESSENTIAL   4. Screening for diabetes mellitus   5. Vitamin D deficiency disease     Tests Ordered:   Orders Placed This Encounter  Procedures  . CBC  . COMPLETE METABOLIC PANEL  WITH GFR  . Hemoglobin A1c  . Lipid panel  . T3, free  . T4  . TSH  . VITAMIN D 25 Hydroxy (Vit-D Deficiency, Fractures)     Plan  1. Blood work is ordered. 2. He will continue with lisinopril for his hypertension. 3. We discussed nutrition today with emphasis on intermittent fasting combined with a plant-based diet.  He realizes he needs to drink at least a gallon of water. 4. Further recommendations will depend on blood results. 5. Follow-up in 3 months. 6. Today, in addition to a preventative visit, I performed an office visit independently to address his obesity and hypertension.     No orders of the defined types were placed in this encounter.    David [name] David [name]   01/30/2020, 1:47 PM

## 2020-01-31 LAB — T4: T4, Total: 8.5 ug/dL (ref 4.9–10.5)

## 2020-01-31 LAB — COMPLETE METABOLIC PANEL WITH GFR
AG Ratio: 1.6 (calc) (ref 1.0–2.5)
ALT: 24 U/L (ref 9–46)
AST: 17 U/L (ref 10–40)
Albumin: 4.3 g/dL (ref 3.6–5.1)
Alkaline phosphatase (APISO): 76 U/L (ref 36–130)
BUN: 14 mg/dL (ref 7–25)
CO2: 29 mmol/L (ref 20–32)
Calcium: 9 mg/dL (ref 8.6–10.3)
Chloride: 102 mmol/L (ref 98–110)
Creat: 0.76 mg/dL (ref 0.60–1.35)
GFR, Est African American: 142 mL/min/{1.73_m2} (ref >=60)
GFR, Est Non African American: 122 mL/min/{1.73_m2} (ref >=60)
Globulin: 2.7 g/dL (calc) (ref 1.9–3.7)
Glucose, Bld: 81 mg/dL (ref 65–99)
Potassium: 4 mmol/L (ref 3.5–5.3)
Sodium: 138 mmol/L (ref 135–146)
Total Bilirubin: 0.4 mg/dL (ref 0.2–1.2)
Total Protein: 7 g/dL (ref 6.1–8.1)

## 2020-01-31 LAB — LIPID PANEL
Cholesterol: 169 mg/dL (ref <200)
HDL: 37 mg/dL — ABNORMAL LOW (ref >=40)
LDL Cholesterol (Calc): 110 mg/dL (calc) — ABNORMAL HIGH
Non-HDL Cholesterol (Calc): 132 mg/dL (calc) — ABNORMAL HIGH (ref <130)
Total CHOL/HDL Ratio: 4.6 (calc) (ref <5.0)
Triglycerides: 114 mg/dL (ref <150)

## 2020-01-31 LAB — TSH: TSH: 0.49 mIU/L (ref 0.40–4.50)

## 2020-01-31 LAB — CBC
HCT: 40 % (ref 38.5–50.0)
Hemoglobin: 13 g/dL — ABNORMAL LOW (ref 13.2–17.1)
MCH: 24.6 pg — ABNORMAL LOW (ref 27.0–33.0)
MCHC: 32.5 g/dL (ref 32.0–36.0)
MCV: 75.8 fL — ABNORMAL LOW (ref 80.0–100.0)
MPV: 9.3 fL (ref 7.5–12.5)
Platelets: 349 10*3/uL (ref 140–400)
RBC: 5.28 10*6/uL (ref 4.20–5.80)
RDW: 14.3 % (ref 11.0–15.0)
WBC: 9.8 10*3/uL (ref 3.8–10.8)

## 2020-01-31 LAB — HEMOGLOBIN A1C
Hgb A1c MFr Bld: 5.5 % of total Hgb (ref <5.7)
Mean Plasma Glucose: 111 (calc)
eAG (mmol/L): 6.2 (calc)

## 2020-01-31 LAB — T3, FREE: T3, Free: 3.4 pg/mL (ref 2.3–4.2)

## 2020-01-31 LAB — VITAMIN D 25 HYDROXY (VIT D DEFICIENCY, FRACTURES): Vit D, 25-Hydroxy: 15 ng/mL — ABNORMAL LOW (ref 30–100)

## 2020-05-07 ENCOUNTER — Ambulatory Visit (INDEPENDENT_AMBULATORY_CARE_PROVIDER_SITE_OTHER): Payer: 59 | Admitting: Internal Medicine

## 2020-05-27 ENCOUNTER — Encounter (INDEPENDENT_AMBULATORY_CARE_PROVIDER_SITE_OTHER): Payer: Self-pay | Admitting: Nurse Practitioner

## 2020-05-27 ENCOUNTER — Ambulatory Visit (INDEPENDENT_AMBULATORY_CARE_PROVIDER_SITE_OTHER): Payer: 59 | Admitting: Nurse Practitioner

## 2020-05-27 ENCOUNTER — Encounter (INDEPENDENT_AMBULATORY_CARE_PROVIDER_SITE_OTHER): Payer: Self-pay | Admitting: Internal Medicine

## 2020-05-27 ENCOUNTER — Other Ambulatory Visit: Payer: Self-pay

## 2020-05-27 VITALS — BP 148/100 | HR 99 | Temp 97.7°F | Ht 71.0 in | Wt 314.4 lb

## 2020-05-27 DIAGNOSIS — I10 Essential (primary) hypertension: Secondary | ICD-10-CM | POA: Diagnosis not present

## 2020-05-27 DIAGNOSIS — T7840XA Allergy, unspecified, initial encounter: Secondary | ICD-10-CM

## 2020-05-27 MED ORDER — DEXAMETHASONE SODIUM PHOSPHATE 4 MG/ML IJ SOLN
4.0000 mg | Freq: Once | INTRAMUSCULAR | Status: AC
  Administered 2020-05-27: 4 mg via INTRAMUSCULAR

## 2020-05-27 MED ORDER — PREDNISONE 20 MG PO TABS: 40.0000 mg | ORAL_TABLET | Freq: Every day | ORAL | 0 refills | Status: DC

## 2020-05-27 MED ORDER — CETIRIZINE HCL 10 MG PO TABS: 10.0000 mg | ORAL_TABLET | Freq: Every day | ORAL | 1 refills | Status: DC

## 2020-05-27 MED ORDER — EPINEPHRINE 0.3 MG/0.3ML IJ SOAJ: 0.3000 mg | INTRAMUSCULAR | 1 refills | Status: DC | PRN

## 2020-05-27 MED ORDER — FAMOTIDINE 20 MG PO TABS: 20.0000 mg | ORAL_TABLET | Freq: Every day | ORAL | 1 refills | Status: DC

## 2020-05-27 NOTE — Progress Notes (Signed)
Subjective:  Patient ID: David [name], male    DOB: 07-09-1990  Age: 29 y.o. MRN: [id number]  CC:  Chief Complaint  Patient presents with  . Rash    rash started on both legs and he noticed it on Monday, spread to all over his body and even on eyes, did eat some cashews and ate on Monday      HPI  This patient arrives today for the above.  He tells me his rash erupted 2 to 3 days ago.  He tells me is very itchy, and it is all over his body.  He denies any swelling of his lips tongue or throat.  He does have chronically enlarged tonsils and does have a hard time swallowing but tells me this is not any worse than it used to be.  He is not sure what he could have been exposed to that could have caused the rash to occur.  He does have a history of allergy to certain laundry detergents and he did have family in town that washed their close and lunch detergent that he is not familiar with.  He also says that he has eaten cashews recently but has not had an issue with these in the past.  He does have history of allergic reaction to passion fruit and Augmentin.  He denies exposure to either of these triggers.  He has tried cold compress on the rash for itching and is tried to rub it with his fingertips as opposed with his fingernails.  Itching is still pretty severe.  Past Medical History:  Diagnosis Date  . Anemia   . Asthma   . ASTHMA, EXERCISE INDUCED 08/17/2006   Qualifier: Diagnosis of  By: Abundio Miu    . ECZEMA, ATOPIC DERMATITIS 08/17/2006   Qualifier: Diagnosis of  By: Abundio Miu    . Enlargement of tonsils   . Hypertension       Family History  Problem Relation Age of Onset  . Cancer Mother   . COPD Mother     Social History   Social History Narrative   Married for 5 years.Lives with wife and daughter.Works at [employer].   Social History   Tobacco Use  . Smoking status: Former Smoker    Quit date: 06/20/2004    Years since quitting: 15.9  .  Smokeless tobacco: Never Used  . Tobacco comment: only smoked for about a year   Substance Use Topics  . Alcohol use: No     Current Meds  Medication Sig  . lisinopril (ZESTRIL) 20 MG tablet Take 1 tablet by mouth once daily   Current Facility-Administered Medications for the 05/27/20 encounter (Office Visit) with Elenore Paddy, NP  Medication  . dexamethasone (DECADRON) injection 4 mg    ROS:  See HPI   Objective:   Today's Vitals: BP (!) 148/100   Pulse 99   Temp 97.7 F (36.5 C) (Temporal)   Ht 5\' 11"  (1.803 m)   Wt (!) 314 lb 6.4 oz (142.6 kg)   SpO2 98%   BMI 43.85 kg/m  Vitals with BMI 05/27/2020 01/30/2020 10/28/2019  Height 5\' 11"  5\' 11"  5\' 11"   Weight 314 lbs 6 oz 306 lbs 10 oz 312 lbs  BMI 43.87 42.78 43.53  Systolic 148 120 12/28/2019  Diastolic 100 72 90  Pulse 99 87 69     Physical Exam Vitals reviewed.  Constitutional:      Appearance: Normal appearance.  HENT:  Head: Normocephalic and atraumatic.  Cardiovascular:     Rate and Rhythm: Normal rate and regular rhythm.  Pulmonary:     Effort: Pulmonary effort is normal.     Breath sounds: Normal breath sounds.  Musculoskeletal:     Cervical back: Neck supple.  Skin:    General: Skin is warm and dry.     Comments: Diffuse hives noted to patients face, lips, neck, chest, abdomen, bilateral arms, and bilateral legs  Neurological:     Mental Status: He is alert and oriented to person, place, and time.  Psychiatric:        Mood and Affect: Mood normal.        Behavior: Behavior normal.        Thought Content: Thought content normal.        Judgment: Judgment normal.          Assessment and Plan   1. Allergic reaction, initial encounter   2. HYPERTENSION, BENIGN ESSENTIAL      Plan: 1.  We will administer dexamethasone steroid injection today for symptom relief.  We will start him on prednisone burst pack tomorrow.  I also recommended he take a Zyrtec and famotidine by mouth daily for the  next 7 to 10 days.  Will prescribe EpiPen and he was told to use this in the event he starts to experience anaphylaxis.  Will refer him to allergy specialist for further evaluation and determining triggering allergen. 2.  Blood pressure is above goal today.  He does take lisinopril 20 mg daily but tells me he has not taken it yet today.  He is encouraged to take the medicine once he gets home and to continue taking this daily as prescribed.  He was told that prednisone can sometimes increase blood pressure and that if he experiences any headache he should monitor his blood pressure at home as well as notify us.   Tests ordered Orders Placed This Encounter  Procedures  . Ambulatory referral to Allergy      Meds ordered this encounter  Medications  . cetirizine (ZYRTEC) 10 MG tablet    Sig: Take 1 tablet (10 mg total) by mouth daily.    Dispense:  30 tablet    Refill:  1    Order Specific Question:   Supervising Provider    Answer:   Lilly Cove C [[id number]]  . famotidine (PEPCID) 20 MG tablet    Sig: Take 1 tablet (20 mg total) by mouth daily.    Dispense:  30 tablet    Refill:  1    Order Specific Question:   Supervising Provider    Answer:   Lilly Cove C [[id number]]  . predniSONE (DELTASONE) 20 MG tablet    Sig: Take 2 tablets (40 mg total) by mouth daily with breakfast.    Dispense:  10 tablet    Refill:  0    Order Specific Question:   Supervising Provider    Answer:   Lilly Cove C [[id number]]  . EPINEPHrine (EPIPEN 2-PAK) 0.3 mg/0.3 mL IJ SOAJ injection    Sig: Inject 0.3 mg into the muscle as needed for anaphylaxis.    Dispense:  1 each    Refill:  1    Order Specific Question:   Supervising Provider    Answer:   Lilly Cove C [[id number]]  . dexamethasone (DECADRON) injection 4 mg    Patient to follow-up in 6-8 weeks, or sooner as needed.  Elenore Paddy, NP

## 2020-07-06 ENCOUNTER — Telehealth (INDEPENDENT_AMBULATORY_CARE_PROVIDER_SITE_OTHER): Payer: 59 | Admitting: Nurse Practitioner

## 2020-07-06 ENCOUNTER — Telehealth (INDEPENDENT_AMBULATORY_CARE_PROVIDER_SITE_OTHER): Payer: Self-pay | Admitting: Nurse Practitioner

## 2020-07-06 ENCOUNTER — Other Ambulatory Visit: Payer: Self-pay

## 2020-07-06 NOTE — Telephone Encounter (Signed)
Will you please call this patient sometime this week to reschedule his follow-up appointment?  He was supposed to see me today (07/06/2020), however they had to cancel due to him having to go into work unexpectedly.

## 2020-07-16 ENCOUNTER — Ambulatory Visit: Payer: Self-pay | Admitting: Allergy

## 2020-07-20 NOTE — Telephone Encounter (Signed)
I got him and his wife scheduled for March 3rd at 5:00 & 5:30

## 2020-08-09 ENCOUNTER — Other Ambulatory Visit (INDEPENDENT_AMBULATORY_CARE_PROVIDER_SITE_OTHER): Payer: Self-pay | Admitting: Nurse Practitioner

## 2020-08-12 ENCOUNTER — Encounter (INDEPENDENT_AMBULATORY_CARE_PROVIDER_SITE_OTHER): Payer: Self-pay | Admitting: Nurse Practitioner

## 2020-08-12 ENCOUNTER — Other Ambulatory Visit (INDEPENDENT_AMBULATORY_CARE_PROVIDER_SITE_OTHER): Payer: Self-pay | Admitting: Nurse Practitioner

## 2020-08-12 DIAGNOSIS — I10 Essential (primary) hypertension: Secondary | ICD-10-CM

## 2020-08-12 MED ORDER — LISINOPRIL 20 MG PO TABS: 20.0000 mg | ORAL_TABLET | Freq: Every day | ORAL | 0 refills | Status: DC

## 2020-08-20 ENCOUNTER — Ambulatory Visit (INDEPENDENT_AMBULATORY_CARE_PROVIDER_SITE_OTHER): Payer: 59 | Admitting: Nurse Practitioner

## 2020-09-15 ENCOUNTER — Ambulatory Visit (INDEPENDENT_AMBULATORY_CARE_PROVIDER_SITE_OTHER): Payer: 59 | Admitting: Internal Medicine

## 2020-09-15 ENCOUNTER — Other Ambulatory Visit: Payer: Self-pay

## 2020-09-15 ENCOUNTER — Encounter (INDEPENDENT_AMBULATORY_CARE_PROVIDER_SITE_OTHER): Payer: Self-pay | Admitting: Internal Medicine

## 2020-09-15 ENCOUNTER — Encounter (INDEPENDENT_AMBULATORY_CARE_PROVIDER_SITE_OTHER): Payer: Self-pay | Admitting: Nurse Practitioner

## 2020-09-15 ENCOUNTER — Other Ambulatory Visit (INDEPENDENT_AMBULATORY_CARE_PROVIDER_SITE_OTHER): Payer: Self-pay | Admitting: Internal Medicine

## 2020-09-15 VITALS — BP 120/80 | HR 95 | Temp 97.8°F | Ht 71.0 in | Wt 311.6 lb

## 2020-09-15 DIAGNOSIS — R059 Cough, unspecified: Secondary | ICD-10-CM

## 2020-09-15 NOTE — Progress Notes (Signed)
Metrics: Intervention Frequency ACO  Documented Smoking Status Yearly  Screened one or more times in 24 months  Cessation Counseling or  Active cessation medication Past 24 months  Past 24 months   Guideline developer: UpToDate (See UpToDate for funding source) Date Released: 2014       Wellness Office Visit  Subjective:  Patient ID: David [name], male    DOB: December 28, 1990  Age: 30 y.o. MRN: [id number]  CC: Cough. HPI  This man gives a 1 week history of cough which was initially productive of green sputum but now is productive of clear sputum.  He denies any dyspnea or wheezing.  He denies any fever.  The episode started with runny nose and traveled into chest congestion.  He has taken 3 at home Covid 19 test and all of been negative.  He says he feels much improved now and he would like to return to work.  Because of his illness, he did not work a couple of days last week when he was supposed to. Past Medical History:  Diagnosis Date  . Anemia   . Asthma   . ASTHMA, EXERCISE INDUCED 08/17/2006   Qualifier: Diagnosis of  By: Abundio Miu    . ECZEMA, ATOPIC DERMATITIS 08/17/2006   Qualifier: Diagnosis of  By: Abundio Miu    . Enlargement of tonsils   . Hypertension    Past Surgical History:  Procedure Laterality Date  . LEG SURGERY Right      Family History  Problem Relation Age of Onset  . Cancer Mother   . COPD Mother     Social History   Social History Narrative   Married for 5 years.Lives with wife and daughter.Works at [employer].   Social History   Tobacco Use  . Smoking status: Former Smoker    Quit date: 06/20/2004    Years since quitting: 16.2  . Smokeless tobacco: Never Used  . Tobacco comment: only smoked for about a year   Substance Use Topics  . Alcohol use: No    Current Meds  Medication Sig  . cetirizine (ZYRTEC) 10 MG tablet Take 1 tablet (10 mg total) by mouth daily.  Marland Kitchen EPINEPHrine (EPIPEN 2-PAK) 0.3 mg/0.3 mL IJ SOAJ injection  Inject 0.3 mg into the muscle as needed for anaphylaxis.  . famotidine (PEPCID) 20 MG tablet Take 1 tablet (20 mg total) by mouth daily.  Marland Kitchen lisinopril (ZESTRIL) 20 MG tablet Take 1 tablet (20 mg total) by mouth daily.     Flowsheet Row Office Visit from 09/15/2020 in [facility]  PHQ-9 Total Score 0      Objective:   Today's Vitals: BP 120/80   Pulse 95   Temp 97.8 F (36.6 C) (Temporal)   Ht 5\' 11"  (1.803 m)   Wt (!) 311 lb 9.6 oz (141.3 kg)   SpO2 98%   BMI 43.46 kg/m  Vitals with BMI 09/15/2020 05/27/2020 01/30/2020  Height 5\' 11"  5\' 11"  5\' 11"   Weight 311 lbs 10 oz 314 lbs 6 oz 306 lbs 10 oz  BMI 43.48 43.87 42.78  Systolic 120 148 03/31/2020  Diastolic 80 100 72  Pulse 95 99 87     Physical Exam  He looks systemically well.  Blood pressure in a good range.  No respiratory distress.  Saturation on room air 98%.  Lung fields are entirely clear with no evidence of wheezing, crackles or bronchial breathing.     Assessment   1. Cough  Tests ordered No orders of the defined types were placed in this encounter.    Plan: 1. It appears that he is recovering from his respiratory illness, likely viral illness that is improved.  He had 3 COVID-19 test which were negative so I doubt that this is COVID-19 disease. 2. I have given him a note to return back to work tomorrow.   No orders of the defined types were placed in this encounter.   Wilson Singer, MD

## 2020-09-16 ENCOUNTER — Ambulatory Visit (INDEPENDENT_AMBULATORY_CARE_PROVIDER_SITE_OTHER): Payer: 59 | Admitting: Nurse Practitioner

## 2020-10-08 ENCOUNTER — Telehealth (INDEPENDENT_AMBULATORY_CARE_PROVIDER_SITE_OTHER): Payer: Self-pay | Admitting: Nurse Practitioner

## 2020-10-08 ENCOUNTER — Other Ambulatory Visit: Payer: Self-pay

## 2020-10-08 ENCOUNTER — Ambulatory Visit (INDEPENDENT_AMBULATORY_CARE_PROVIDER_SITE_OTHER): Payer: 59 | Admitting: Nurse Practitioner

## 2020-10-08 ENCOUNTER — Encounter (INDEPENDENT_AMBULATORY_CARE_PROVIDER_SITE_OTHER): Payer: Self-pay | Admitting: Nurse Practitioner

## 2020-10-08 VITALS — BP 130/81 | HR 93 | Temp 97.2°F | Resp 19 | Ht 71.0 in | Wt 314.4 lb

## 2020-10-08 DIAGNOSIS — J351 Hypertrophy of tonsils: Secondary | ICD-10-CM | POA: Diagnosis not present

## 2020-10-08 DIAGNOSIS — R062 Wheezing: Secondary | ICD-10-CM | POA: Diagnosis not present

## 2020-10-08 DIAGNOSIS — R079 Chest pain, unspecified: Secondary | ICD-10-CM

## 2020-10-08 DIAGNOSIS — E559 Vitamin D deficiency, unspecified: Secondary | ICD-10-CM

## 2020-10-08 DIAGNOSIS — I10 Essential (primary) hypertension: Secondary | ICD-10-CM

## 2020-10-08 MED ORDER — VITAMIN D3 25 MCG (1000 UT) PO CAPS: 1000.0000 [IU] | ORAL_CAPSULE | Freq: Every day | ORAL | 3 refills | Status: DC

## 2020-10-08 MED ORDER — ALBUTEROL SULFATE HFA 108 (90 BASE) MCG/ACT IN AERS: 2.0000 | INHALATION_SPRAY | Freq: Four times a day (QID) | RESPIRATORY_TRACT | 0 refills | Status: DC | PRN

## 2020-10-08 NOTE — Progress Notes (Signed)
Subjective:  Patient ID: David [name], male    DOB: 08/08/1990  Age: 30 y.o. MRN: [id number]  CC:  Chief Complaint  Patient presents with  . Hypertension  . Follow-up  . Obesity  . Other    Enlarged tonsils, vitamin D deficiency  . Chest Pain       HPI  This patient arrives today for the above.  Hypertension: He continues on lisinopril and tolerating this well.  Obesity: He has been trying to lose weight and has been fairly unsuccessful thus far.  He has tried to participate in intermittent fasting but tells me he will often feel unwell when he does this and wants to know what other options he has regarding assistance with weight loss.  Enlarged tonsils: He has a history of enlarged tonsils and prior to the COVID-19 pandemic he was getting ready for tonsillectomy with his ear nose and throat doctor.  Due to the pandemic this had been canceled and he never followed up with Dr. Suszanne Conners.  He tells me that his friend was visiting the other night and woke him up out of sleep because he stated the patient stopped breathing and was making concerning noises when trying to breathe.  The patient tells me that he has never undergone sleep test, but was told by Dr. Suszanne Conners that due to the size of the patient's tonsils recommendation would be first to have tonsils removed and if nighttime breathing abnormalities did not improve with removal of tonsils to then send patient for sleep study.  The patient would like referral back to Dr. Suszanne Conners to discuss tonsil removal.  Chest pain: He also has had a history of atypical chest pain.  He has been evaluated with cardiology and has undergone multiple testing.  They have not found any evidence of coronary artery disease, but the patient does have a significant family history of coronary artery disease.  He also has multiple risk factors for coronary artery disease.  He tells me he does continue to have episodes of chest pain, last episode occurred approximately 2  weeks ago.  He denies any chest pain today while in the office.  He tells me when it does happen it feels like a stabbing and often 10/10 pain.  He denies any shortness of breath or wheezing during the episodes.  He tells me the episodes can last anywhere from a few hours to a few days.  He denies any triggering events such as trauma or activity.  Vitamin D deficiency: He has a history of vitamin D deficiency last serum level showed a level of 15.  Not currently on any supplement.  Past Medical History:  Diagnosis Date  . Anemia   . Asthma   . ASTHMA, EXERCISE INDUCED 08/17/2006   Qualifier: Diagnosis of  By: Abundio Miu    . ECZEMA, ATOPIC DERMATITIS 08/17/2006   Qualifier: Diagnosis of  By: Abundio Miu    . Enlargement of tonsils   . Hypertension       Family History  Problem Relation Age of Onset  . Cancer Mother   . COPD Mother     Social History   Social History Narrative   Married for 5 years.Lives with wife and daughter.Works at [employer].   Social History   Tobacco Use  . Smoking status: Former Smoker    Quit date: 06/20/2004    Years since quitting: 16.3  . Smokeless tobacco: Never Used  . Tobacco comment: only smoked for  about a year   Substance Use Topics  . Alcohol use: No     Current Meds  Medication Sig  . albuterol (VENTOLIN HFA) 108 (90 Base) MCG/ACT inhaler Inhale 2 puffs into the lungs every 6 (six) hours as needed for wheezing or shortness of breath.  . cetirizine (ZYRTEC) 10 MG tablet Take 1 tablet (10 mg total) by mouth daily.  . Cholecalciferol (VITAMIN D3) 25 MCG (1000 UT) CAPS Take 1 capsule (1,000 Units total) by mouth daily.  Marland Kitchen EPINEPHrine (EPIPEN 2-PAK) 0.3 mg/0.3 mL IJ SOAJ injection Inject 0.3 mg into the muscle as needed for anaphylaxis.  . famotidine (PEPCID) 20 MG tablet Take 1 tablet (20 mg total) by mouth daily.  Marland Kitchen lisinopril (ZESTRIL) 20 MG tablet Take 1 tablet (20 mg total) by mouth daily.    ROS:  Review of  Systems  Constitutional: Positive for malaise/fatigue.  Respiratory: Positive for wheezing (at night that he associates with apneic episodes). Negative for cough and shortness of breath.   Cardiovascular: Positive for chest pain.     Objective:   Today's Vitals: BP 130/81 (BP Location: Right Arm, Patient Position: Sitting, Cuff Size: Normal)   Pulse 93   Temp (!) 97.2 F (36.2 C) (Temporal)   Resp 19   Ht 5\' 11"  (1.803 m)   Wt (!) 314 lb 6.4 oz (142.6 kg)   SpO2 97%   BMI 43.85 kg/m  Vitals with BMI 10/08/2020 09/15/2020 05/27/2020  Height 5\' 11"  5\' 11"  5\' 11"   Weight 314 lbs 6 oz 311 lbs 10 oz 314 lbs 6 oz  BMI 43.87 43.48 43.87  Systolic 130 120 14/01/2020  Diastolic 81 80 100  Pulse 93 95 99     Physical Exam Vitals reviewed.  Constitutional:      Appearance: Normal appearance.  HENT:     Head: Normocephalic and atraumatic.  Cardiovascular:     Rate and Rhythm: Normal rate and regular rhythm.  Pulmonary:     Effort: Pulmonary effort is normal.     Breath sounds: Normal breath sounds.  Musculoskeletal:     Cervical back: Neck supple.  Skin:    General: Skin is warm and dry.  Neurological:     Mental Status: He is alert and oriented to person, place, and time.  Psychiatric:        Mood and Affect: Mood normal.        Behavior: Behavior normal.        Thought Content: Thought content normal.        Judgment: Judgment normal.          Assessment and Plan   1. Enlarged tonsils   2. Wheezing   3. Vitamin D deficiency disease   4. Morbid obesity (HCC)   5. HYPERTENSION, BENIGN ESSENTIAL   6. Chest pain, unspecified type      Plan: 1., 2.  We will refer him back to the ear nose and throat provider for further assistance with managing his enlarged tonsils.  I encouraged him to consider doing sleep study, but he would like to follow the advice of the ear nose and throat provider and wait to do sleep study until after he has his tonsils removed.  During review of  systems he also mention he experiences some wheezing at times when he has his apneic episodes and he does have a history of asthma.  I will prescribe albuterol inhaler that he can take as needed for wheezing. 3.  I recommended he  restart vitamin D3 supplement. 4.  We will refer him to the healthy weight and wellness clinic for further assistance with managing his obesity. 5.  Blood pressure appears reasonable on current regimen to continue taking his lisinopril as prescribed. 6.  I recommended he reach back out to Dr. Diona Browner to see if further testing can be conducted for evaluation of the chest pain.  He plans on calling their office tomorrow.    Tests ordered Orders Placed This Encounter  Procedures  . Ambulatory referral to ENT  . Amb Ref to Medical Weight Management      Meds ordered this encounter  Medications  . albuterol (VENTOLIN HFA) 108 (90 Base) MCG/ACT inhaler    Sig: Inhale 2 puffs into the lungs every 6 (six) hours as needed for wheezing or shortness of breath.    Dispense:  8 g    Refill:  0    Order Specific Question:   Supervising Provider    Answer:   Karilyn Cota, NIMISH C [[id number]]  . Cholecalciferol (VITAMIN D3) 25 MCG (1000 UT) CAPS    Sig: Take 1 capsule (1,000 Units total) by mouth daily.    Dispense:  90 capsule    Refill:  3    Order Specific Question:   Supervising Provider    Answer:   Wilson Singer [[id number]]    Patient to follow-up in 1 month at which point he will be due to have vitamin D serum level checked.  Elenore Paddy, NP

## 2020-10-08 NOTE — Telephone Encounter (Signed)
Ordered referral to Dr. Suszanne Conners if he is still practicing for evaluation and management of enlarged tonsils.   He would also like to be referred the healthy weight and wellness clinic to discuss weight loss.

## 2020-10-12 NOTE — Telephone Encounter (Signed)
Ok will work up today.

## 2020-11-16 ENCOUNTER — Encounter (INDEPENDENT_AMBULATORY_CARE_PROVIDER_SITE_OTHER): Payer: Self-pay | Admitting: Nurse Practitioner

## 2020-11-17 ENCOUNTER — Other Ambulatory Visit (INDEPENDENT_AMBULATORY_CARE_PROVIDER_SITE_OTHER): Payer: Self-pay | Admitting: Internal Medicine

## 2020-11-17 MED ORDER — NIRMATRELVIR/RITONAVIR (PAXLOVID)TABLET: 3.0000 | ORAL_TABLET | Freq: Two times a day (BID) | ORAL | 0 refills | Status: AC

## 2020-11-18 ENCOUNTER — Telehealth (INDEPENDENT_AMBULATORY_CARE_PROVIDER_SITE_OTHER): Payer: 59 | Admitting: Nurse Practitioner

## 2020-11-18 ENCOUNTER — Other Ambulatory Visit: Payer: Self-pay

## 2020-11-18 ENCOUNTER — Encounter (INDEPENDENT_AMBULATORY_CARE_PROVIDER_SITE_OTHER): Payer: Self-pay | Admitting: Nurse Practitioner

## 2020-11-18 VITALS — BP 157/96 | HR 92 | Temp 97.5°F | Resp 18 | Ht 71.0 in | Wt 314.0 lb

## 2020-11-18 DIAGNOSIS — I10 Essential (primary) hypertension: Secondary | ICD-10-CM

## 2020-11-18 DIAGNOSIS — J351 Hypertrophy of tonsils: Secondary | ICD-10-CM | POA: Diagnosis not present

## 2020-11-18 DIAGNOSIS — R079 Chest pain, unspecified: Secondary | ICD-10-CM | POA: Diagnosis not present

## 2020-11-18 DIAGNOSIS — U071 COVID-19: Secondary | ICD-10-CM

## 2020-11-18 DIAGNOSIS — E559 Vitamin D deficiency, unspecified: Secondary | ICD-10-CM | POA: Insufficient documentation

## 2020-11-18 NOTE — Progress Notes (Addendum)
Due to national recommendations of social distancing related to the COVID19 pandemic, an audio/visual tele-health visit was felt to be the most appropriate encounter type for this patient today. I connected with  David [name] on 11/18/20 utilizing audio/visual technology and verified that I am speaking with the correct person using two identifiers. The patient was located at their home, and I was located at the office of [facility] during the encounter. I discussed the limitations of evaluation and management by telemedicine. The patient expressed understanding and agreed to proceed.      Subjective:  Patient ID: David [name], male    DOB: [birth year]  Age: 30 y.o. MRN: [id number]  CC:  Chief Complaint  Patient presents with  . Covid Positive  . Chest Pain  . Hypertension  . Other    Enlarged tonsils, vitamin D deficiency  . Obesity      HPI  This patient arrives today for the above.  COVID-19: He has current COVID-19 infection (symptoms onset about 4 days ago) and is currently taking Paxil which was prescribed to him by Dr. Karilyn Cota.  He tells me his symptoms have been stable and not much improved since starting the medication.  He continues to experience productive cough, shortness of breath with exertion, dizziness with exertion, and nasal congestion.  He tells me he did have an episode of chills a few days ago but this has not come back.  He has not had any chest pain since finding out he is positive for COVID-19.  Chest pain: He has seen Dr. Diona Browner in the past for evaluation of chest pain.  At last office visit I recommended that he call Dr. Diona Browner to see if he can be reevaluated for the chest pain.  He tells me he has called them but has not heard back from that office.  He has had an episode of chest pain approximately week and a half ago.  Hypertension: He continues on lisinopril and is tolerating this well.  Enlarged tonsils: He was supposed to see an ear  nose and throat doctor later this week, but had to reschedule it due to being COVID-positive.  Vitamin D deficiency: At last office visit I recommend he start a vitamin D3 supplement of 1000 IUs of vitamin D3 daily.  He tells me he has made this medication change.  Last vitamin D serum check showed a level of 15.  Obesity: He was referred to the healthy weight and wellness clinic has not yet heard from them regarding getting appointment scheduled.  Past Medical History:  Diagnosis Date  . Anemia   . Asthma   . ASTHMA, EXERCISE INDUCED 08/17/2006   Qualifier: Diagnosis of  By: Abundio Miu    . ECZEMA, ATOPIC DERMATITIS 08/17/2006   Qualifier: Diagnosis of  By: Abundio Miu    . Enlargement of tonsils   . Hypertension       Family History  Problem Relation Age of Onset  . Cancer Mother   . COPD Mother     Social History   Social History Narrative   Married for 5 years.Lives with wife and daughter.Works at [employer].   Social History   Tobacco Use  . Smoking status: Former Smoker    Quit date: 06/20/2004    Years since quitting: 16.4  . Smokeless tobacco: Never Used  . Tobacco comment: only smoked for about a year   Substance Use Topics  . Alcohol use: No  Current Meds  Medication Sig  . albuterol (VENTOLIN HFA) 108 (90 Base) MCG/ACT inhaler Inhale 2 puffs into the lungs every 6 (six) hours as needed for wheezing or shortness of breath.  . cetirizine (ZYRTEC) 10 MG tablet Take 1 tablet (10 mg total) by mouth daily.  . Cholecalciferol (VITAMIN D3) 25 MCG (1000 UT) CAPS Take 1 capsule (1,000 Units total) by mouth daily.  Marland Kitchen EPINEPHrine (EPIPEN 2-PAK) 0.3 mg/0.3 mL IJ SOAJ injection Inject 0.3 mg into the muscle as needed for anaphylaxis.  . famotidine (PEPCID) 20 MG tablet Take 1 tablet (20 mg total) by mouth daily.  Marland Kitchen lisinopril (ZESTRIL) 20 MG tablet Take 1 tablet (20 mg total) by mouth daily.  . nirmatrelvir/ritonavir EUA (PAXLOVID) TABS Take 3  tablets by mouth 2 (two) times daily for 5 days. Patient GFR is 122. Take nirmatrelvir (150 mg) two tablets twice daily for 5 days and ritonavir (100 mg) one tablet twice daily for 5 days.    ROS:  Review of Systems  Constitutional: Positive for chills and malaise/fatigue. Negative for fever.  HENT: Positive for congestion.   Respiratory: Positive for cough, sputum production and shortness of breath.   Cardiovascular: Negative for chest pain.  Neurological: Positive for dizziness (with exertion). Negative for headaches.     Objective:   Today's Vitals: BP (!) 157/96 (BP Location: Other (Comment), Patient Position: Sitting, Cuff Size: Large)   Pulse 92   Temp (!) 97.5 F (36.4 C) (Temporal)   Resp 18   Ht 5\' 11"  (1.803 m)   Wt (!) 314 lb (142.4 kg)   BMI 43.79 kg/m  Vitals with BMI 11/18/2020 10/08/2020 09/15/2020  Height 5\' 11"  5\' 11"  5\' 11"   Weight 314 lbs 314 lbs 6 oz 311 lbs 10 oz  BMI 43.81 43.87 43.48  Systolic 157 130 09/17/2020  Diastolic 96 81 80  Pulse 92 93 95     Physical Exam Comprehensive physical exam not completed today as office visit was conducted remotely.  Patient appeared fairly well on video.  He did not appear to be in respiratory distress.  He did cough a couple times..  Patient was alert and oriented, and appeared to have appropriate judgment.       Assessment and Plan   1. Chest pain, unspecified type   2. HYPERTENSION, BENIGN ESSENTIAL   3. Morbid obesity (HCC)   4. Enlarged tonsils   5. Vitamin D deficiency disease   6. COVID-19      Plan: 1.  I will order referral to cardiology with Dr. for further assistance with evaluating his chest pain. 2.  He was encouraged to continue taking his lisinopril as prescribed. 3.  I recommended he call the healthy weight and wellness center to see if he can get an appointment scheduled.  He tells me he will try this. 4.  He was encouraged to follow-up with the ear nose and throat doctor once his  COVID-19 infection is better. 5.  We will consider getting serum vitamin D level at next office visit in the meantime he will continue taking his supplement. 6.  He will continue taking the Paxlovid, was told to proceed to the emergency department if symptoms worsen.  He tells me he understands.   Tests ordered Orders Placed This Encounter  Procedures  . Ambulatory referral to Cardiology      No orders of the defined types were placed in this encounter.   Patient to follow-up in 3 months or sooner as  needed.  Elenore Paddy, NP

## 2020-11-19 ENCOUNTER — Telehealth (INDEPENDENT_AMBULATORY_CARE_PROVIDER_SITE_OTHER): Payer: Self-pay | Admitting: Nurse Practitioner

## 2020-11-19 ENCOUNTER — Encounter (INDEPENDENT_AMBULATORY_CARE_PROVIDER_SITE_OTHER): Payer: Self-pay | Admitting: Nurse Practitioner

## 2020-11-19 NOTE — Telephone Encounter (Signed)
Please call patient and discussed how he can pick up his FMLA paperwork.  I have completed it and would like for [place] to keep a copy for our records, but he can get the originals.  Thank you.

## 2021-01-12 ENCOUNTER — Ambulatory Visit: Payer: 59 | Admitting: Physician Assistant

## 2021-02-02 ENCOUNTER — Ambulatory Visit: Payer: 59 | Admitting: Cardiology

## 2021-02-02 NOTE — Progress Notes (Deleted)
Cardiology Office Note  Date: 02/02/2021   ID: ASAR David [name], DOB 10/22/1990, MRN [id number]  PCP:  Elenore Paddy, NP  Cardiologist:  Nona Dell, MD Electrophysiologist:  None   No chief complaint on file.   History of Present Illness: David [name] is a 30 y.o. male seen in consultation back in February 2020 for evaluation of chest discomfort.  Cardiac CTA at that time showed normal coronaries with calcium score of 0 and echocardiography reported normal LVEF greater than 65% with no major valvular abnormalities.  Chest pain was felt to be noncardiac.  He is referred back to the office by Ms. Wallace Cullens NP for follow-up of chest discomfort.  Records indicate COVID-19 diagnosis back in June.  Past Medical History:  Diagnosis Date   Anemia    Asthma    ASTHMA, EXERCISE INDUCED 08/17/2006   Qualifier: Diagnosis of  By: Abundio Miu     ECZEMA, ATOPIC DERMATITIS 08/17/2006   Qualifier: Diagnosis of  By: Abundio Miu     Enlargement of tonsils    Hypertension     Past Surgical History:  Procedure Laterality Date   LEG SURGERY Right     Current Outpatient Medications  Medication Sig Dispense Refill   albuterol (VENTOLIN HFA) 108 (90 Base) MCG/ACT inhaler Inhale 2 puffs into the lungs every 6 (six) hours as needed for wheezing or shortness of breath. 8 g 0   cetirizine (ZYRTEC) 10 MG tablet Take 1 tablet (10 mg total) by mouth daily. 30 tablet 1   Cholecalciferol (VITAMIN D3) 25 MCG (1000 UT) CAPS Take 1 capsule (1,000 Units total) by mouth daily. 90 capsule 3   EPINEPHrine (EPIPEN 2-PAK) 0.3 mg/0.3 mL IJ SOAJ injection Inject 0.3 mg into the muscle as needed for anaphylaxis. 1 each 1   famotidine (PEPCID) 20 MG tablet Take 1 tablet (20 mg total) by mouth daily. 30 tablet 1   lisinopril (ZESTRIL) 20 MG tablet Take 1 tablet (20 mg total) by mouth daily. 90 tablet 0   No current facility-administered medications for this visit.   Allergies:  Amoxicillin-pot clavulanate,  Other, Passion fruit flavor, and Latex   Social History: The patient  reports that he quit smoking about 16 years ago. He has never used smokeless tobacco. He reports that he does not drink alcohol and does not use drugs.   Family History: The patient's family history includes COPD in his mother; Cancer in his mother.   ROS:  Please see the history of present illness. Otherwise, complete review of systems is positive for {NONE DEFAULTED:18576}.  All other systems are reviewed and negative.   Physical Exam: VS:  There were no vitals taken for this visit., BMI There is no height or weight on file to calculate BMI.  Wt Readings from Last 3 Encounters:  11/18/20 (!) 314 lb (142.4 kg)  10/08/20 (!) 314 lb 6.4 oz (142.6 kg)  09/15/20 (!) 311 lb 9.6 oz (141.3 kg)    General: Patient appears comfortable at rest. HEENT: Conjunctiva and lids normal, oropharynx clear with moist mucosa. Neck: Supple, no elevated JVP or carotid bruits, no thyromegaly. Lungs: Clear to auscultation, nonlabored breathing at rest. Cardiac: Regular rate and rhythm, no S3 or significant systolic murmur, no pericardial rub. Abdomen: Soft, nontender, no hepatomegaly, bowel sounds present, no guarding or rebound. Extremities: No pitting edema, distal pulses 2+. Skin: Warm and dry. Musculoskeletal: No kyphosis. Neuropsychiatric: Alert and oriented x3, affect grossly appropriate.  ECG:  An ECG dated 08/10/2018  was personally reviewed today and demonstrated:  Sinus rhythm with small R' in lead V1 and V2.  Recent Labwork:    Component Value Date/Time   CHOL 169 01/30/2020 1349   TRIG 114 01/30/2020 1349   HDL 37 (L) 01/30/2020 1349   CHOLHDL 4.6 01/30/2020 1349   LDLCALC 110 (H) 01/30/2020 1349  August 2021: Hemoglobin 13.0, platelets 349, BUN 14, creatinine 0.76, potassium 4.0, AST 17, ALT 24, hemoglobin A1c 5.5%, TSH 0.49  Other Studies Reviewed Today:  Echocardiogram 11/22/2018:  1. The left ventricle has  hyperdynamic systolic function, with an  ejection fraction of >65%. The cavity size was normal. Left ventricular  diastolic parameters were normal. No evidence of left ventricular regional  wall motion abnormalities.   2. The right ventricle has normal systolic function. The cavity was  normal. There is no increase in right ventricular wall thickness.   3. The aortic valve is tricuspid. Mild aortic annular calcification  noted.   4. The mitral valve is grossly normal.   5. The tricuspid valve is grossly normal.   6. The aortic root is normal in size and structure.   Coronary CTA 03/04/2019: IMPRESSION: 1. Coronary calcium score of 0. This was 0 percentile for age and sex matched control.   2. Normal coronary origin with right dominance.   3. No evidence of CAD.  CADRADS 0.  Assessment and Plan:   Medication Adjustments/Labs and Tests Ordered: Current medicines are reviewed at length with the patient today.  Concerns regarding medicines are outlined above.   Tests Ordered: No orders of the defined types were placed in this encounter.   Medication Changes: No orders of the defined types were placed in this encounter.   Disposition:  Follow up {follow up:15908}  Signed, Jonelle Sidle, MD, [facility] 02/02/2021 8:02 AM    [facility] [address] [place], [place] [address] Phone: [phone]; Fax: [phone]

## 2021-02-11 ENCOUNTER — Other Ambulatory Visit (INDEPENDENT_AMBULATORY_CARE_PROVIDER_SITE_OTHER): Payer: Self-pay | Admitting: Nurse Practitioner

## 2021-02-11 DIAGNOSIS — R062 Wheezing: Secondary | ICD-10-CM

## 2021-02-11 DIAGNOSIS — T7840XA Allergy, unspecified, initial encounter: Secondary | ICD-10-CM

## 2021-02-11 DIAGNOSIS — I10 Essential (primary) hypertension: Secondary | ICD-10-CM

## 2021-02-11 MED ORDER — EPINEPHRINE 0.3 MG/0.3ML IJ SOAJ: 0.3000 mg | INTRAMUSCULAR | 1 refills | Status: DC | PRN

## 2021-02-11 MED ORDER — ALBUTEROL SULFATE HFA 108 (90 BASE) MCG/ACT IN AERS: 2.0000 | INHALATION_SPRAY | Freq: Four times a day (QID) | RESPIRATORY_TRACT | 0 refills | Status: DC | PRN

## 2021-02-11 MED ORDER — LISINOPRIL 20 MG PO TABS: 20.0000 mg | ORAL_TABLET | Freq: Every day | ORAL | 0 refills | Status: DC

## 2021-02-25 ENCOUNTER — Ambulatory Visit (INDEPENDENT_AMBULATORY_CARE_PROVIDER_SITE_OTHER): Payer: 59 | Admitting: Nurse Practitioner

## 2021-02-25 ENCOUNTER — Ambulatory Visit (INDEPENDENT_AMBULATORY_CARE_PROVIDER_SITE_OTHER): Payer: 59 | Admitting: Internal Medicine

## 2021-03-18 ENCOUNTER — Ambulatory Visit: Payer: 59 | Admitting: Nurse Practitioner

## 2021-03-25 ENCOUNTER — Other Ambulatory Visit: Payer: Self-pay

## 2021-03-25 ENCOUNTER — Encounter: Payer: Self-pay | Admitting: Nurse Practitioner

## 2021-03-25 ENCOUNTER — Ambulatory Visit (INDEPENDENT_AMBULATORY_CARE_PROVIDER_SITE_OTHER): Payer: 59 | Admitting: Nurse Practitioner

## 2021-03-25 VITALS — BP 128/80 | HR 72 | Resp 18 | Ht 71.0 in | Wt 311.6 lb

## 2021-03-25 DIAGNOSIS — R3911 Hesitancy of micturition: Secondary | ICD-10-CM

## 2021-03-25 DIAGNOSIS — E559 Vitamin D deficiency, unspecified: Secondary | ICD-10-CM | POA: Diagnosis not present

## 2021-03-25 DIAGNOSIS — Z131 Encounter for screening for diabetes mellitus: Secondary | ICD-10-CM

## 2021-03-25 DIAGNOSIS — I10 Essential (primary) hypertension: Secondary | ICD-10-CM | POA: Diagnosis not present

## 2021-03-25 DIAGNOSIS — E785 Hyperlipidemia, unspecified: Secondary | ICD-10-CM

## 2021-03-25 DIAGNOSIS — Z1329 Encounter for screening for other suspected endocrine disorder: Secondary | ICD-10-CM

## 2021-03-25 LAB — COMPREHENSIVE METABOLIC PANEL
ALT: 19 U/L (ref 0–53)
AST: 19 U/L (ref 0–37)
Albumin: 4.1 g/dL (ref 3.5–5.2)
Alkaline Phosphatase: 83 U/L (ref 39–117)
BUN: 9 mg/dL (ref 6–23)
CO2: 29 mEq/L (ref 19–32)
Calcium: 9.4 mg/dL (ref 8.4–10.5)
Chloride: 102 mEq/L (ref 96–112)
Creatinine, Ser: 0.83 mg/dL (ref 0.40–1.50)
GFR: 116.88 mL/min (ref >60.00)
Glucose, Bld: 86 mg/dL (ref 70–99)
Potassium: 4.3 mEq/L (ref 3.5–5.1)
Sodium: 137 mEq/L (ref 135–145)
Total Bilirubin: 0.5 mg/dL (ref 0.2–1.2)
Total Protein: 7.2 g/dL (ref 6.0–8.3)

## 2021-03-25 LAB — CBC WITH DIFFERENTIAL/PLATELET
Basophils Absolute: 0.1 10*3/uL (ref 0.0–0.1)
Basophils Relative: 0.8 % (ref 0.0–3.0)
Eosinophils Absolute: 0.5 10*3/uL (ref 0.0–0.7)
Eosinophils Relative: 5.2 % — ABNORMAL HIGH (ref 0.0–5.0)
HCT: 40.2 % (ref 39.0–52.0)
Hemoglobin: 13 g/dL (ref 13.0–17.0)
Lymphocytes Relative: 29.8 % (ref 12.0–46.0)
Lymphs Abs: 2.9 10*3/uL (ref 0.7–4.0)
MCHC: 32.4 g/dL (ref 30.0–36.0)
MCV: 75.2 fl — ABNORMAL LOW (ref 78.0–100.0)
Monocytes Absolute: 0.6 10*3/uL (ref 0.1–1.0)
Monocytes Relative: 6.5 % (ref 3.0–12.0)
Neutro Abs: 5.5 10*3/uL (ref 1.4–7.7)
Neutrophils Relative %: 57.7 % (ref 43.0–77.0)
Platelets: 340 10*3/uL (ref 150.0–400.0)
RBC: 5.35 Mil/uL (ref 4.22–5.81)
RDW: 15 % (ref 11.5–15.5)
WBC: 9.6 10*3/uL (ref 4.0–10.5)

## 2021-03-25 LAB — LIPID PANEL
Cholesterol: 186 mg/dL (ref 0–200)
HDL: 36 mg/dL — ABNORMAL LOW (ref >39.00)
LDL Cholesterol: 128 mg/dL — ABNORMAL HIGH (ref 0–99)
NonHDL: 150.3
Total CHOL/HDL Ratio: 5
Triglycerides: 112 mg/dL (ref 0.0–149.0)
VLDL: 22.4 mg/dL (ref 0.0–40.0)

## 2021-03-25 LAB — HEMOGLOBIN A1C: Hgb A1c MFr Bld: 5.6 % (ref 4.6–6.5)

## 2021-03-25 LAB — VITAMIN D 25 HYDROXY (VIT D DEFICIENCY, FRACTURES): VITD: 17.1 ng/mL — ABNORMAL LOW (ref 30.00–100.00)

## 2021-03-25 LAB — TSH: TSH: 0.6 u[IU]/mL (ref 0.35–5.50)

## 2021-03-25 NOTE — Progress Notes (Signed)
Subjective:  Patient ID: David [name], male    DOB: [birth year]  Age: 30 y.o. MRN: [id number]  CC:  Chief Complaint  Patient presents with   Follow-up    No concerns.       HPI  This patient arrives today for the above.  He is reestablishing care with me as he was a patient at my old practice.  His only concern today is that he only urinates about once a day.  He does not feel the need to urinate any more than that.  He tells me when he does urinate he feels like he is emptying his bladder fully.  He does feel that he drinks enough throughout the day.  He does not have any dysuria or hematuria.  He does not feel he has to strain to urinate.  He says sometimes his urine has a strong odor but not always.  Blood work was collected a little over a year ago and GFR at that time was normal.  Past Medical History:  Diagnosis Date   Anemia    Asthma    ASTHMA, EXERCISE INDUCED 08/17/2006   Qualifier: Diagnosis of  By: Abundio Miu     ECZEMA, ATOPIC DERMATITIS 08/17/2006   Qualifier: Diagnosis of  By: Abundio Miu     Enlargement of tonsils    Hypertension       Family History  Problem Relation Age of Onset   Cancer Mother    COPD Mother     Social History   Social History Narrative   Married for 5 years.Lives with wife and daughter.Works at [employer].   Social History   Tobacco Use   Smoking status: Former    Types: Cigarettes    Quit date: 06/20/2004    Years since quitting: 16.7   Smokeless tobacco: Never   Tobacco comments:    only smoked for about a year   Substance Use Topics   Alcohol use: No     Current Meds  Medication Sig   albuterol (VENTOLIN HFA) 108 (90 Base) MCG/ACT inhaler Inhale 2 puffs into the lungs every 6 (six) hours as needed for wheezing or shortness of breath.   cetirizine (ZYRTEC) 10 MG tablet Take 1 tablet (10 mg total) by mouth daily.   Cholecalciferol (VITAMIN D3) 25 MCG (1000 UT) CAPS Take 1 capsule (1,000 Units  total) by mouth daily.   EPINEPHrine (EPIPEN 2-PAK) 0.3 mg/0.3 mL IJ SOAJ injection Inject 0.3 mg into the muscle as needed for anaphylaxis.   famotidine (PEPCID) 20 MG tablet Take 1 tablet (20 mg total) by mouth daily.   lisinopril (ZESTRIL) 20 MG tablet Take 1 tablet (20 mg total) by mouth daily.    ROS:  See HPI   Objective:   Today's Vitals: BP 128/80   Pulse 72   Resp 18   Ht 5\' 11"  (1.803 m)   Wt (!) 311 lb 9.6 oz (141.3 kg)   SpO2 97%   BMI 43.46 kg/m  Vitals with BMI 03/25/2021 11/18/2020 10/08/2020  Height 5\' 11"  5\' 11"  5\' 11"   Weight 311 lbs 10 oz 314 lbs 314 lbs 6 oz  BMI 43.48 43.81 43.87  Systolic 128 157 10/10/2020  Diastolic 80 96 81  Pulse 72 92 93     Physical Exam Vitals reviewed.  Constitutional:      Appearance: Normal appearance.  HENT:     Head: Normocephalic and atraumatic.  Cardiovascular:     Rate and Rhythm:  Normal rate and regular rhythm.  Pulmonary:     Effort: Pulmonary effort is normal.     Breath sounds: Normal breath sounds.  Musculoskeletal:     Cervical back: Neck supple.  Skin:    General: Skin is warm and dry.  Neurological:     Mental Status: He is alert and oriented to person, place, and time.  Psychiatric:        Mood and Affect: Mood normal.        Behavior: Behavior normal.        Thought Content: Thought content normal.        Judgment: Judgment normal.         Assessment and Plan   1. Vitamin D deficiency disease   2. HYPERTENSION, BENIGN ESSENTIAL   3. Urinary hesitancy   4. Morbid obesity (HCC)   5. Screening for diabetes mellitus   6. Thyroid disorder screen   7. Hyperlipidemia, unspecified hyperlipidemia type      Plan: 1.-7.  We will collect blood work for further evaluation today.  We will also check urinalysis and further recommendations may be made based upon these results.  Not sure of etiology of only urinating once a day.  My suspicion is that he may not be drinking as much as he should be.  We will  await urinalysis and CMP results before making further recommendations.   Tests ordered Orders Placed This Encounter  Procedures   TSH   Hemoglobin A1c   Comprehensive metabolic panel   CBC with Differential/Platelet   Vitamin D (25 hydroxy)   Lipid panel   Urinalysis      No orders of the defined types were placed in this encounter.   Patient to follow-up in 3 months for annual physical exam or sooner as needed.  Elenore Paddy, NP

## 2021-03-25 NOTE — Addendum Note (Signed)
Addended by: Madelon Lips on: 03/25/2021 02:50 PM   Modules accepted: Orders

## 2021-07-08 ENCOUNTER — Encounter: Payer: Self-pay | Admitting: Nurse Practitioner

## 2021-07-08 ENCOUNTER — Other Ambulatory Visit: Payer: Self-pay

## 2021-07-08 ENCOUNTER — Ambulatory Visit (INDEPENDENT_AMBULATORY_CARE_PROVIDER_SITE_OTHER): Payer: 59 | Admitting: Nurse Practitioner

## 2021-07-08 VITALS — BP 138/98 | HR 84 | Temp 98.4°F | Ht 71.0 in | Wt 307.0 lb

## 2021-07-08 DIAGNOSIS — I10 Essential (primary) hypertension: Secondary | ICD-10-CM

## 2021-07-08 DIAGNOSIS — E559 Vitamin D deficiency, unspecified: Secondary | ICD-10-CM

## 2021-07-08 DIAGNOSIS — R197 Diarrhea, unspecified: Secondary | ICD-10-CM | POA: Diagnosis not present

## 2021-07-08 NOTE — Patient Instructions (Signed)
Low-FODMAP Eating Plan °FODMAP stands for fermentable oligosaccharides, disaccharides, monosaccharides, and polyols. These are sugars that are hard for some people to digest. A low-FODMAP eating plan may help some people who have irritable bowel syndrome (IBS) and certain other bowel (intestinal) diseases to manage their symptoms. °This meal plan can be complicated to follow. Work with a diet and nutrition specialist (dietitian) to make a low-FODMAP eating plan that is right for you. A dietitian can help make sure that you get enough nutrition from this diet. °What are tips for following this plan? °Reading food labels °Check labels for hidden FODMAPs such as: °High-fructose syrup. °Honey. °Agave. °Natural fruit flavors. °Onion or garlic powder. °Choose low-FODMAP foods that contain 3-4 grams of fiber per serving. °Check food labels for serving sizes. Eat only one serving at a time to make sure FODMAP levels stay low. °Shopping °Shop with a list of foods that are recommended on this diet and make a meal plan. °Meal planning °Follow a low-FODMAP eating plan for up to 6 weeks, or as told by your health care provider or dietitian. °To follow the eating plan: °Eliminate high-FODMAP foods from your diet completely. Choose only low-FODMAP foods to eat. You will do this for 2-6 weeks. °Gradually reintroduce high-FODMAP foods into your diet one at a time. Most people should wait a few days before introducing the next new high-FODMAP food into their meal plan. Your dietitian can recommend how quickly you may reintroduce foods. °Keep a daily record of what and how much you eat and drink. Make note of any symptoms that you have after eating. °Review your daily record with a dietitian regularly to identify which foods you can eat and which foods you should avoid. °General tips °Drink enough fluid each day to keep your urine pale yellow. °Avoid processed foods. These often have added sugar and may be high in FODMAPs. °Avoid most  dairy products, whole grains, and sweeteners. °Work with a dietitian to make sure you get enough fiber in your diet. °Avoid high FODMAP foods at meals to manage symptoms. °Recommended foods °Fruits °Bananas, oranges, tangerines, lemons, limes, blueberries, raspberries, strawberries, grapes, cantaloupe, honeydew melon, kiwi, papaya, passion fruit, and pineapple. Limited amounts of dried cranberries, banana chips, and shredded coconut. °Vegetables °Eggplant, zucchini, cucumber, peppers, green beans, bean sprouts, lettuce, arugula, kale, Swiss chard, spinach, collard greens, bok choy, summer squash, potato, and tomato. Limited amounts of corn, carrot, and sweet potato. Green parts of scallions. °Grains °Gluten-free grains, such as rice, oats, buckwheat, quinoa, corn, polenta, and millet. Gluten-free pasta, bread, or cereal. Rice noodles. Corn tortillas. °Meats and other proteins °Unseasoned beef, pork, poultry, or fish. Eggs. Bacon. Tofu (firm) and tempeh. Limited amounts of nuts and seeds, such as almonds, walnuts, brazil nuts, pecans, peanuts, nut butters, pumpkin seeds, chia seeds, and sunflower seeds. °Dairy °Lactose-free milk, yogurt, and kefir. Lactose-free cottage cheese and ice cream. Non-dairy milks, such as almond, coconut, hemp, and rice milk. Non-dairy yogurt. Limited amounts of goat cheese, brie, mozzarella, parmesan, swiss, and other hard cheeses. °Fats and oils °Butter-free spreads. Vegetable oils, such as olive, canola, and sunflower oil. °Seasoning and other foods °Artificial sweeteners with names that do not end in "ol," such as aspartame, saccharine, and stevia. Maple syrup, white table sugar, raw sugar, brown sugar, and molasses. Mayonnaise, soy sauce, and tamari. Fresh basil, coriander, parsley, rosemary, and thyme. °Beverages °Water and mineral water. Sugar-sweetened soft drinks. Small amounts of orange juice or cranberry juice. Black and green tea. Most dry wines. Coffee. °  The items listed above  may not be a complete list of foods and beverages you can eat. Contact a dietitian for more information. °Foods to avoid °Fruits °Fresh, dried, and juiced forms of apple, pear, watermelon, peach, plum, cherries, apricots, blackberries, boysenberries, figs, nectarines, and mango. Avocado. °Vegetables °Chicory root, artichoke, asparagus, cabbage, snow peas, Brussels sprouts, broccoli, sugar snap peas, mushrooms, celery, and cauliflower. Onions, garlic, leeks, and the white part of scallions. °Grains °Wheat, including kamut, durum, and semolina. Barley and bulgur. Couscous. Wheat-based cereals. Wheat noodles, bread, crackers, and pastries. °Meats and other proteins °Fried or fatty meat. Sausage. Cashews and pistachios. Soybeans, baked beans, black beans, chickpeas, kidney beans, fava beans, navy beans, lentils, black-eyed peas, and split peas. °Dairy °Milk, yogurt, ice cream, and soft cheese. Cream and sour cream. Milk-based sauces. Custard. Buttermilk. Soy milk. °Seasoning and other foods °Any sugar-free gum or candy. Foods that contain artificial sweeteners such as sorbitol, mannitol, isomalt, or xylitol. Foods that contain honey, high-fructose corn syrup, or agave. Bouillon, vegetable stock, beef stock, and chicken stock. Garlic and onion powder. Condiments made with onion, such as hummus, chutney, pickles, relish, salad dressing, and salsa. Tomato paste. °Beverages °Chicory-based drinks. Coffee substitutes. Chamomile tea. Fennel tea. Sweet or fortified wines such as port or sherry. Diet soft drinks made with isomalt, mannitol, maltitol, sorbitol, or xylitol. Apple, pear, and mango juice. Juices with high-fructose corn syrup. °The items listed above may not be a complete list of foods and beverages you should avoid. Contact a dietitian for more information. °Summary °FODMAP stands for fermentable oligosaccharides, disaccharides, monosaccharides, and polyols. These are sugars that are hard for some people to  digest. °A low-FODMAP eating plan is a short-term diet that helps to ease symptoms of certain bowel diseases. °The eating plan usually lasts up to 6 weeks. After that, high-FODMAP foods are reintroduced gradually and one at a time. This can help you find out which foods may be causing symptoms. °A low-FODMAP eating plan can be complicated. It is best to work with a dietitian who has experience with this type of plan. °This information is not intended to replace advice given to you by your health care provider. Make sure you discuss any questions you have with your health care provider. °Document Revised: 10/24/2019 Document Reviewed: 10/24/2019 °Elsevier Patient Education © 2022 [employer] ° °

## 2021-07-08 NOTE — Progress Notes (Signed)
Subjective:  Patient ID: David [name], male    DOB: Jan 14, 1991  Age: 31 y.o. MRN: [id number]  CC:  Chief Complaint  Patient presents with   Follow-up   Hypertension      HPI  This patient arrives today for the above.  Hypertension: He is prescribed lisinopril 20 mg by mouth daily.  He tells me he has not been taking this routinely but knows that he should be.  He does get a headache if his blood pressure gets elevated.  He tells me he does check it periodically at home and systolically has seen as high as the 150s.  Diarrhea: He has frequent loose stools and this has been ongoing issue for years.  He believes he may have IBS-D.  He also tells me that his flatulence is foul-smelling.  He tells me it does not bother him much but does bother his wife.  Vitamin D deficiency: Blood work was collected at last office visit which showed vitamin D level of 17.  He tells me he does take vitamin D supplement, but ran out of it a few days ago so has not taken it in the last few days.  Past Medical History:  Diagnosis Date   Anemia    Asthma    ASTHMA, EXERCISE INDUCED 08/17/2006   Qualifier: Diagnosis of  By: Abundio Miu     ECZEMA, ATOPIC DERMATITIS 08/17/2006   Qualifier: Diagnosis of  By: Abundio Miu     Enlargement of tonsils    Hypertension       Family History  Problem Relation Age of Onset   Cancer Mother    COPD Mother     Social History   Social History Narrative   Married for 5 years.Lives with wife and daughter.Works at [employer].   Social History   Tobacco Use   Smoking status: Former    Types: Cigarettes    Quit date: 06/20/2004    Years since quitting: 17.0   Smokeless tobacco: Never   Tobacco comments:    only smoked for about a year   Substance Use Topics   Alcohol use: No     No outpatient medications have been marked as taking for the 07/08/21 encounter (Office Visit) with Elenore Paddy, NP.    ROS:  Review of Systems   Respiratory:  Negative for shortness of breath.   Cardiovascular:  Negative for chest pain.  Gastrointestinal:  Positive for diarrhea. Negative for abdominal pain and blood in stool.  Neurological:  Positive for headaches.    Objective:   Today's Vitals: BP (!) 138/98 (BP Location: Left Arm, Patient Position: Sitting, Cuff Size: Large)    Pulse 84    Temp 98.4 F (36.9 C) (Oral)    Ht 5\' 11"  (1.803 m)    Wt (!) 307 lb (139.3 kg)    SpO2 98%    BMI 42.82 kg/m  Vitals with BMI 07/08/2021 03/25/2021 11/18/2020  Height 5\' 11"  5\' 11"  5\' 11"   Weight 307 lbs 311 lbs 10 oz 314 lbs  BMI 42.84 43.48 43.81  Systolic 138 128 01/18/2021  Diastolic 98 80 96  Pulse 84 72 92     Physical Exam Vitals reviewed.  Constitutional:      Appearance: Normal appearance.  HENT:     Head: Normocephalic and atraumatic.  Cardiovascular:     Rate and Rhythm: Normal rate and regular rhythm.  Pulmonary:     Effort: Pulmonary effort is normal.  Breath sounds: Normal breath sounds.  Musculoskeletal:     Cervical back: Neck supple.  Skin:    General: Skin is warm and dry.  Neurological:     Mental Status: He is alert and oriented to person, place, and time.  Psychiatric:        Mood and Affect: Mood normal.        Behavior: Behavior normal.        Thought Content: Thought content normal.        Judgment: Judgment normal.         Assessment and Plan   1. HYPERTENSION, BENIGN ESSENTIAL   2. Diarrhea, unspecified type   3. Vitamin D deficiency disease      Plan: 1.  I encouraged him to continue taking his lisinopril as prescribed as opposed to sporadically.  He tells me he will try. 2.  I recommended he try probiotic as well as fiber to see if this will bulk up the stool.  He will consider this and let me know if this is helped his diarrhea at next office visit. 3.  He was encouraged to start taking his vitamin D3 on a regular basis again.  He tells me he understands.   Tests ordered No orders of  the defined types were placed in this encounter.     No orders of the defined types were placed in this encounter.   Patient to follow-up in 2 3 months for CPE, or sooner as needed.  Elenore Paddy, NP

## 2021-08-25 ENCOUNTER — Ambulatory Visit (INDEPENDENT_AMBULATORY_CARE_PROVIDER_SITE_OTHER): Payer: 59

## 2021-08-25 ENCOUNTER — Ambulatory Visit
Admission: RE | Admit: 2021-08-25 | Discharge: 2021-08-25 | Disposition: A | Discharge status: Home / Self Care | Payer: 59 | Source: Ambulatory Visit | Attending: Urgent Care | Admitting: Urgent Care

## 2021-08-25 ENCOUNTER — Other Ambulatory Visit: Payer: Self-pay

## 2021-08-25 VITALS — BP 131/85 | HR 86 | Temp 98.3°F | Resp 18

## 2021-08-25 DIAGNOSIS — M5416 Radiculopathy, lumbar region: Secondary | ICD-10-CM | POA: Diagnosis not present

## 2021-08-25 DIAGNOSIS — M545 Low back pain, unspecified: Secondary | ICD-10-CM

## 2021-08-25 DIAGNOSIS — M5441 Lumbago with sciatica, right side: Secondary | ICD-10-CM | POA: Diagnosis not present

## 2021-08-25 DIAGNOSIS — M5442 Lumbago with sciatica, left side: Secondary | ICD-10-CM

## 2021-08-25 MED ORDER — NAPROXEN 500 MG PO TABS: 500.0000 mg | ORAL_TABLET | Freq: Two times a day (BID) | ORAL | 0 refills | Status: DC

## 2021-08-25 MED ORDER — PREDNISONE 50 MG PO TABS: 50.0000 mg | ORAL_TABLET | Freq: Every day | ORAL | 0 refills | Status: DC

## 2021-08-25 MED ORDER — TIZANIDINE HCL 4 MG PO TABS: 4.0000 mg | ORAL_TABLET | Freq: Every day | ORAL | 0 refills | Status: DC

## 2021-08-25 NOTE — ED Triage Notes (Signed)
Pt reports middle back pain x 2 days. Pt reports pain in legs and buttocks when lay in bed. Tylenol and Aleve gives no relief.  ?

## 2021-08-25 NOTE — ED Provider Notes (Signed)
?[facility] ? ? ?MRN: [id number] DOB: [birth year] ? ?Subjective:  ? ?David [name] is a 31 y.o. male presenting for 2-day history of acute onset recurrent low back pain that radiates into both thighs.  Patient states that he has longstanding history of recurrent pains like this.  Has never seen a back specialist for it.  No recent fall, trauma.  He does have a family history of scoliosis.  No weakness, numbness or tingling, saddle paresthesia.  No hematuria, changes to bowel or urinary habits. ? ?No current facility-administered medications for this encounter. ? ?Current Outpatient Medications:  ?  albuterol (VENTOLIN HFA) 108 (90 Base) MCG/ACT inhaler, Inhale 2 puffs into the lungs every 6 (six) hours as needed for wheezing or shortness of breath. (Patient not taking: Reported on 07/08/2021), Disp: 8 g, Rfl: 0 ?  cetirizine (ZYRTEC) 10 MG tablet, Take 1 tablet (10 mg total) by mouth daily. (Patient not taking: Reported on 07/08/2021), Disp: 30 tablet, Rfl: 1 ?  Cholecalciferol (VITAMIN D3) 25 MCG (1000 UT) CAPS, Take 1 capsule (1,000 Units total) by mouth daily. (Patient not taking: Reported on 07/08/2021), Disp: 90 capsule, Rfl: 3 ?  EPINEPHrine (EPIPEN 2-PAK) 0.3 mg/0.3 mL IJ SOAJ injection, Inject 0.3 mg into the muscle as needed for anaphylaxis. (Patient not taking: Reported on 07/08/2021), Disp: 1 each, Rfl: 1 ?  famotidine (PEPCID) 20 MG tablet, Take 1 tablet (20 mg total) by mouth daily. (Patient not taking: Reported on 07/08/2021), Disp: 30 tablet, Rfl: 1 ?  lisinopril (ZESTRIL) 20 MG tablet, Take 1 tablet (20 mg total) by mouth daily., Disp: 90 tablet, Rfl: 0  ? ?Allergies  ?Allergen Reactions  ? Amoxicillin-Pot Clavulanate Anaphylaxis  ?  Pt reports that he "stops breathing"  ? Other Anaphylaxis  ? Passion Fruit Flavor Anaphylaxis  ? Latex Swelling  ? ? ?Past Medical History:  ?Diagnosis Date  ? Anemia   ? Asthma   ? ASTHMA, EXERCISE INDUCED 08/17/2006  ? Qualifier: Diagnosis of  By: Abundio Miu    ? ECZEMA, ATOPIC DERMATITIS 08/17/2006  ? Qualifier: Diagnosis of  By: Abundio Miu    ? Enlargement of tonsils   ? Hypertension   ?  ? ?Past Surgical History:  ?Procedure Laterality Date  ? LEG SURGERY Right   ? ? ?Family History  ?Problem Relation Age of Onset  ? Cancer Mother   ? COPD Mother   ? ? ?Social History  ? ?Tobacco Use  ? Smoking status: Former  ?  Types: Cigarettes  ?  Quit date: 06/20/2004  ?  Years since quitting: 17.1  ? Smokeless tobacco: Never  ? Tobacco comments:  ?  only smoked for about a year   ?Vaping Use  ? Vaping Use: Never used  ?Substance Use Topics  ? Alcohol use: No  ? Drug use: No  ? ? ?ROS ? ? ?Objective:  ? ?Vitals: ?BP 131/85 (BP Location: Right Arm)   Pulse 86   Temp 98.3 ?F (36.8 ?C) (Oral)   Resp 18   SpO2 97%  ? ?Physical Exam ?Constitutional:   ?   General: He is not in acute distress. ?   Appearance: Normal appearance. He is well-developed and normal weight. He is not ill-appearing, toxic-appearing or diaphoretic.  ?HENT:  ?   Head: Normocephalic and atraumatic.  ?   Right Ear: External ear normal.  ?   Left Ear: External ear normal.  ?   Nose: Nose normal.  ?   Mouth/Throat:  ?  Pharynx: Oropharynx is clear.  ?Eyes:  ?   General: No scleral icterus.    ?   Right eye: No discharge.     ?   Left eye: No discharge.  ?   Extraocular Movements: Extraocular movements intact.  ?Cardiovascular:  ?   Rate and Rhythm: Normal rate.  ?Pulmonary:  ?   Effort: Pulmonary effort is normal.  ?Musculoskeletal:  ?   Cervical back: Normal range of motion.  ?   Comments: Full range of motion throughout.  Strength 5/5 for lower extremities.  Patient ambulates without any assistance at expected pace but does move gingerly favoring low back.  No ecchymosis, swelling, lacerations or abrasions.  Patient does have paraspinal muscle tenderness along the lumbar region of his back including the midline.  Positive straight leg raise to the right and left. ? ?  ?Skin: ?   General: Skin is  warm and dry.  ?Neurological:  ?   Mental Status: He is alert and oriented to person, place, and time.  ?   Motor: No weakness.  ?   Coordination: Coordination normal.  ?   Gait: Gait normal.  ?   Deep Tendon Reflexes: Reflexes normal.  ?Psychiatric:     ?   Mood and Affect: Mood normal.     ?   Behavior: Behavior normal.     ?   Thought Content: Thought content normal.     ?   Judgment: Judgment normal.  ? ? ?DG Lumbar Spine Complete ? ?Result Date: 08/25/2021 ?CLINICAL DATA:  Low back pain EXAM: LUMBAR SPINE - COMPLETE 4+ VIEW COMPARISON:  10/13/2005 FINDINGS: There is no evidence of lumbar spine fracture. Alignment is normal. Intervertebral disc spaces are maintained. IMPRESSION: Negative. Electronically Signed   By: Duanne Guess D.O.   On: 08/25/2021 14:31   ? ?Assessment and Plan :  ? ?PDMP not reviewed this encounter. ? ?1. Lumbar radiculopathy   ?2. Acute bilateral low back pain with bilateral sciatica   ? ?Recommended an oral prednisone course, use naproxen thereafter.  Tizanidine is okay together with the prednisone and naproxen.  Follow-up with either [facility] or [facility] in [place].  Patient is a good candidate for an MRI to evaluate for his lumbar radiculopathy given the negative x-ray. Counseled patient on potential for adverse effects with medications prescribed/recommended today, ER and return-to-clinic precautions discussed, patient verbalized understanding. ? ?  ?Wallis Bamberg, PA-C ?08/25/21 1448 ? ?

## 2021-10-07 ENCOUNTER — Ambulatory Visit: Payer: 59 | Admitting: Nurse Practitioner

## 2021-10-08 ENCOUNTER — Other Ambulatory Visit: Payer: Self-pay | Admitting: Nurse Practitioner

## 2021-10-08 ENCOUNTER — Encounter: Payer: Self-pay | Admitting: Nurse Practitioner

## 2021-10-08 DIAGNOSIS — M5442 Lumbago with sciatica, left side: Secondary | ICD-10-CM

## 2021-10-08 MED ORDER — TIZANIDINE HCL 4 MG PO TABS: 4.0000 mg | ORAL_TABLET | Freq: Every day | ORAL | 0 refills | Status: DC

## 2021-11-04 ENCOUNTER — Ambulatory Visit: Payer: 59 | Admitting: Nurse Practitioner

## 2021-11-19 ENCOUNTER — Ambulatory Visit (INDEPENDENT_AMBULATORY_CARE_PROVIDER_SITE_OTHER): Payer: 59 | Admitting: Nurse Practitioner

## 2021-11-19 VITALS — BP 124/76 | HR 83 | Temp 98.2°F | Ht 71.0 in | Wt 313.0 lb

## 2021-11-19 DIAGNOSIS — R718 Other abnormality of red blood cells: Secondary | ICD-10-CM

## 2021-11-19 DIAGNOSIS — E559 Vitamin D deficiency, unspecified: Secondary | ICD-10-CM

## 2021-11-19 DIAGNOSIS — Z131 Encounter for screening for diabetes mellitus: Secondary | ICD-10-CM | POA: Insufficient documentation

## 2021-11-19 DIAGNOSIS — I10 Essential (primary) hypertension: Secondary | ICD-10-CM | POA: Diagnosis not present

## 2021-11-19 DIAGNOSIS — Z6841 Body Mass Index (BMI) 40.0 and over, adult: Secondary | ICD-10-CM

## 2021-11-19 LAB — IRON: Iron: 50 ug/dL (ref 42–165)

## 2021-11-19 LAB — CBC
HCT: 39.5 % (ref 39.0–52.0)
Hemoglobin: 13.2 g/dL (ref 13.0–17.0)
MCHC: 33.3 g/dL (ref 30.0–36.0)
MCV: 75.6 fl — ABNORMAL LOW (ref 78.0–100.0)
Platelets: 331 10*3/uL (ref 150.0–400.0)
RBC: 5.23 Mil/uL (ref 4.22–5.81)
RDW: 15 % (ref 11.5–15.5)
WBC: 9.1 10*3/uL (ref 4.0–10.5)

## 2021-11-19 LAB — COMPREHENSIVE METABOLIC PANEL
ALT: 20 U/L (ref 0–53)
AST: 21 U/L (ref 0–37)
Albumin: 4.1 g/dL (ref 3.5–5.2)
Alkaline Phosphatase: 78 U/L (ref 39–117)
BUN: 9 mg/dL (ref 6–23)
CO2: 27 mEq/L (ref 19–32)
Calcium: 9.3 mg/dL (ref 8.4–10.5)
Chloride: 103 mEq/L (ref 96–112)
Creatinine, Ser: 0.77 mg/dL (ref 0.40–1.50)
GFR: 119.01 mL/min (ref >60.00)
Glucose, Bld: 80 mg/dL (ref 70–99)
Potassium: 4.1 mEq/L (ref 3.5–5.1)
Sodium: 138 mEq/L (ref 135–145)
Total Bilirubin: 0.4 mg/dL (ref 0.2–1.2)
Total Protein: 7.5 g/dL (ref 6.0–8.3)

## 2021-11-19 LAB — LIPID PANEL
Cholesterol: 207 mg/dL — ABNORMAL HIGH (ref 0–200)
HDL: 38.7 mg/dL — ABNORMAL LOW (ref >39.00)
LDL Cholesterol: 151 mg/dL — ABNORMAL HIGH (ref 0–99)
NonHDL: 168.79
Total CHOL/HDL Ratio: 5
Triglycerides: 91 mg/dL (ref 0.0–149.0)
VLDL: 18.2 mg/dL (ref 0.0–40.0)

## 2021-11-19 LAB — FERRITIN: Ferritin: 85.8 ng/mL (ref 22.0–322.0)

## 2021-11-19 LAB — VITAMIN D 25 HYDROXY (VIT D DEFICIENCY, FRACTURES): VITD: 10.32 ng/mL — ABNORMAL LOW (ref 30.00–100.00)

## 2021-11-19 LAB — HEMOGLOBIN A1C: Hgb A1c MFr Bld: 5.5 % (ref 4.6–6.5)

## 2021-11-19 NOTE — Assessment & Plan Note (Signed)
We will check A1c today, further recommendations may be made based upon these results. 

## 2021-11-19 NOTE — Assessment & Plan Note (Signed)
We will check blood work for further evaluation, patient encouraged to focus on lifestyle measures including diet and physical exercise.

## 2021-11-19 NOTE — Assessment & Plan Note (Signed)
Chronic, will check serum vitamin D level for further evaluation.

## 2021-11-19 NOTE — Assessment & Plan Note (Signed)
Chronic, stable.  Patient reported not taking blood pressure medication today.  Blood pressure well controlled today, he was encouraged to take his blood pressure medication on a regular basis.  He reports his understanding.  We will check metabolic panel for evaluation of kidney function.

## 2021-11-19 NOTE — Progress Notes (Signed)
 Established Patient Office Visit  Subjective   Patient ID: David [name], male    DOB: 01/16/1991  Age: 30 y.o. MRN: [id number]  Chief Complaint  Patient presents with   well visit    No concerns   Patient arrives today for annual physical exam.  He has no acute complaints today.  He has history of hyperlipidemia, he is not on statin therapy currently.  He also has history of vitamin D deficiency, he takes vitamin D3 supplementation 5000 IUs by mouth periodically.  He has been told to take it daily but does not take it consistently.  He also has hypertension and is treated with lisinopril, but does not take his antihypertensive routinely either.    Review of Systems  Constitutional:  Negative for fever, malaise/fatigue and weight loss.  Eyes:  Negative for blurred vision and double vision.  Respiratory:  Negative for shortness of breath.   Cardiovascular:  Negative for chest pain.  Gastrointestinal:  Negative for abdominal pain and blood in stool.  Neurological:  Positive for headaches (when BP is high). Negative for dizziness.  Psychiatric/Behavioral:  Negative for depression and suicidal ideas.      Objective:     BP 124/76 (BP Location: Right Arm, Patient Position: Sitting, Cuff Size: Large)   Pulse 83   Temp 98.2 F (36.8 C) (Oral)   Ht 5' 11" (1.803 m)   Wt (!) 313 lb (142 kg)   SpO2 97%   BMI 43.65 kg/m  BP Readings from Last 3 Encounters:  11/19/21 124/76  08/25/21 131/85  07/08/21 (!) 138/98   Wt Readings from Last 3 Encounters:  11/19/21 (!) 313 lb (142 kg)  07/08/21 (!) 307 lb (139.3 kg)  03/25/21 (!) 311 lb 9.6 oz (141.3 kg)      Physical Exam Vitals reviewed.  Constitutional:      General: He is not in acute distress.    Appearance: Normal appearance. He is not ill-appearing.  HENT:     Head: Normocephalic and atraumatic.     Right Ear: Tympanic membrane, ear canal and external ear normal.     Left Ear: Tympanic membrane, ear canal and external  ear normal.  Eyes:     General: No scleral icterus.    Extraocular Movements: Extraocular movements intact.     Conjunctiva/sclera: Conjunctivae normal.     Pupils: Pupils are equal, round, and reactive to light.  Neck:     Vascular: No carotid bruit.  Cardiovascular:     Rate and Rhythm: Normal rate and regular rhythm.     Pulses: Normal pulses.     Heart sounds: Normal heart sounds.  Pulmonary:     Effort: Pulmonary effort is normal.     Breath sounds: Normal breath sounds.  Abdominal:     General: Bowel sounds are normal. There is no distension.     Palpations: There is no mass.     Tenderness: There is no abdominal tenderness.     Hernia: No hernia is present.  Musculoskeletal:        General: No swelling or tenderness.     Cervical back: Normal range of motion and neck supple. No rigidity.  Lymphadenopathy:     Cervical: No cervical adenopathy.  Skin:    General: Skin is warm and dry.  Neurological:     General: No focal deficit present.     Mental Status: He is alert and oriented to person, place, and time.     Cranial Nerves:   No cranial nerve deficit.     Sensory: No sensory deficit.     Motor: No weakness.     Gait: Gait normal.  Psychiatric:        Mood and Affect: Mood normal.        Behavior: Behavior normal.        Judgment: Judgment normal.     Results for orders placed or performed in visit on 11/19/21  Lipid Profile  Result Value Ref Range   Cholesterol 207 (H) 0 - 200 mg/dL   Triglycerides 91.0 0.0 - 149.0 mg/dL   HDL 38.70 (L) >39.00 mg/dL   VLDL 18.2 0.0 - 40.0 mg/dL   LDL Cholesterol 151 (H) 0 - 99 mg/dL   Total CHOL/HDL Ratio 5    NonHDL 168.79   HgB A1c  Result Value Ref Range   Hgb A1c MFr Bld 5.5 4.6 - 6.5 %  Vitamin D (25 hydroxy)  Result Value Ref Range   VITD 10.32 (L) 30.00 - 100.00 ng/mL  Comp Met (CMET)  Result Value Ref Range   Sodium 138 135 - 145 mEq/L   Potassium 4.1 3.5 - 5.1 mEq/L   Chloride 103 96 - 112 mEq/L   CO2 27  19 - 32 mEq/L   Glucose, Bld 80 70 - 99 mg/dL   BUN 9 6 - 23 mg/dL   Creatinine, Ser 0.77 0.40 - 1.50 mg/dL   Total Bilirubin 0.4 0.2 - 1.2 mg/dL   Alkaline Phosphatase 78 39 - 117 U/L   AST 21 0 - 37 U/L   ALT 20 0 - 53 U/L   Total Protein 7.5 6.0 - 8.3 g/dL   Albumin 4.1 3.5 - 5.2 g/dL   GFR 119.01 >60.00 mL/min   Calcium 9.3 8.4 - 10.5 mg/dL  Ferritin  Result Value Ref Range   Ferritin 85.8 22.0 - 322.0 ng/mL  Iron  Result Value Ref Range   Iron 50 42 - 165 ug/dL  CBC  Result Value Ref Range   WBC 9.1 4.0 - 10.5 K/uL   RBC 5.23 4.22 - 5.81 Mil/uL   Platelets 331.0 150.0 - 400.0 K/uL   Hemoglobin 13.2 13.0 - 17.0 g/dL   HCT 39.5 39.0 - 52.0 %   MCV 75.6 (L) 78.0 - 100.0 fl   MCHC 33.3 30.0 - 36.0 g/dL   RDW 15.0 11.5 - 15.5 %      The ASCVD Risk score (Arnett DK, et al., 2019) failed to calculate for the following reasons:   The 2019 ASCVD risk score is only valid for ages 40 to 79    Assessment & Plan:   Problem List Items Addressed This Visit       Cardiovascular and Mediastinum   HYPERTENSION, BENIGN ESSENTIAL - Primary   Relevant Orders   CBC (Completed)   Iron (Completed)   Ferritin (Completed)   Comp Met (CMET) (Completed)   Vitamin D (25 hydroxy) (Completed)   HgB A1c (Completed)   Lipid Profile (Completed)     Other   Vitamin D deficiency disease   Relevant Orders   CBC (Completed)   Iron (Completed)   Ferritin (Completed)   Comp Met (CMET) (Completed)   Vitamin D (25 hydroxy) (Completed)   HgB A1c (Completed)   Lipid Profile (Completed)   Other Visit Diagnoses     Elevated MCV       Relevant Orders   CBC (Completed)   Iron (Completed)   Ferritin (Completed)   Comp Met (CMET) (  Completed)   Vitamin D (25 hydroxy) (Completed)   HgB A1c (Completed)   Lipid Profile (Completed)   Diabetes mellitus screening       Relevant Orders   CBC (Completed)   Iron (Completed)   Ferritin (Completed)   Comp Met (CMET) (Completed)   Vitamin D (25  hydroxy) (Completed)   HgB A1c (Completed)   Lipid Profile (Completed)   Class 3 severe obesity with serious comorbidity and body mass index (BMI) of 40.0 to 44.9 in adult, unspecified obesity type (HCC)       Relevant Orders   CBC (Completed)   Iron (Completed)   Ferritin (Completed)   Comp Met (CMET) (Completed)   Vitamin D (25 hydroxy) (Completed)   HgB A1c (Completed)   Lipid Profile (Completed)       We will also check CBC and iron studies for evaluation of history of low MCV.  Return in about 6 months (around 05/21/2022) for 6 month for Judson Roch; annual exam in 1 year.    Ailene Ards, NP

## 2022-02-05 ENCOUNTER — Other Ambulatory Visit (INDEPENDENT_AMBULATORY_CARE_PROVIDER_SITE_OTHER): Payer: Self-pay | Admitting: Nurse Practitioner

## 2022-02-05 DIAGNOSIS — I10 Essential (primary) hypertension: Secondary | ICD-10-CM

## 2022-02-28 ENCOUNTER — Encounter: Payer: Self-pay | Admitting: Nurse Practitioner

## 2022-05-26 ENCOUNTER — Ambulatory Visit: Payer: 59 | Admitting: Nurse Practitioner

## 2022-05-30 DIAGNOSIS — R201 Hypoesthesia of skin: Secondary | ICD-10-CM | POA: Diagnosis not present

## 2022-05-30 DIAGNOSIS — M545 Low back pain, unspecified: Secondary | ICD-10-CM | POA: Diagnosis not present

## 2023-03-31 ENCOUNTER — Telehealth: Payer: Self-pay | Admitting: Internal Medicine

## 2023-03-31 NOTE — Telephone Encounter (Signed)
New patient approval. Patient has already scheduled through mychart.

## 2023-06-01 ENCOUNTER — Ambulatory Visit: Payer: Medicaid Other | Admitting: Internal Medicine

## 2023-07-27 ENCOUNTER — Ambulatory Visit: Payer: Medicaid Other | Admitting: Internal Medicine

## 2023-07-27 ENCOUNTER — Encounter: Payer: Self-pay | Admitting: Internal Medicine

## 2023-07-27 VITALS — BP 120/80 | HR 72 | Ht 71.0 in | Wt 326.8 lb

## 2023-07-27 DIAGNOSIS — J4520 Mild intermittent asthma, uncomplicated: Secondary | ICD-10-CM | POA: Diagnosis not present

## 2023-07-27 DIAGNOSIS — E66813 Obesity, class 3: Secondary | ICD-10-CM | POA: Diagnosis not present

## 2023-07-27 DIAGNOSIS — K529 Noninfective gastroenteritis and colitis, unspecified: Secondary | ICD-10-CM | POA: Insufficient documentation

## 2023-07-27 DIAGNOSIS — I10 Essential (primary) hypertension: Secondary | ICD-10-CM

## 2023-07-27 DIAGNOSIS — Z6841 Body Mass Index (BMI) 40.0 and over, adult: Secondary | ICD-10-CM

## 2023-07-27 DIAGNOSIS — E559 Vitamin D deficiency, unspecified: Secondary | ICD-10-CM

## 2023-07-27 DIAGNOSIS — T7840XA Allergy, unspecified, initial encounter: Secondary | ICD-10-CM

## 2023-07-27 DIAGNOSIS — Z114 Encounter for screening for human immunodeficiency virus [HIV]: Secondary | ICD-10-CM | POA: Diagnosis not present

## 2023-07-27 MED ORDER — EPINEPHRINE 0.3 MG/0.3ML IJ SOAJ: 0.3000 mg | INTRAMUSCULAR | Status: AC | PRN

## 2023-07-27 NOTE — Patient Instructions (Signed)
 It was a pleasure to see you today.  Thank you for giving us  the opportunity to be involved in your care.  Below is a brief recap of your visit and next steps.  We will plan to see you again in 6 months.  Summary You have established care today No medication changes were made We will check basic labs Tentatively plan for follow up in 6 months

## 2023-07-27 NOTE — Progress Notes (Signed)
 New Patient Office Visit  Subjective    Patient ID: David [name], male    DOB: [birth year]  Age: 33 y.o. MRN: [id number]  CC:  Chief Complaint  Patient presents with   Establish Care    HPI David [name] presents to establish care.  He is a 33 year old male with a previous documented past medical history significant for hypertension, asthma, vitamin D  deficiency, and morbid obesity.  Previously followed by David Pereyra, NP.  David [name] reports feeling well today.  He is asymptomatic and has no acute concerns to discuss aside from desiring to establish care.  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  Denies tobacco, alcohol, and illicit drug use.  Family medical history is significant for COPD, ovarian cancer, lung cancer, HTN, and diabetes mellitus.  Chronic medical conditions and outstanding preventative care items discussed today are individually addressed 9/fibula.   Outpatient Encounter Medications as of 07/27/2023  Medication Sig   EPINEPHrine  (EPIPEN  2-PAK) 0.3 mg/0.3 mL IJ SOAJ injection Inject 0.3 mg into the muscle as needed for anaphylaxis.   [DISCONTINUED] Cholecalciferol 50 MCG (2000 UT) CAPS Take 3 capsules by mouth 3 (three) times a week. (Patient not taking: Reported on 07/27/2023)   [DISCONTINUED] EPINEPHrine  (EPIPEN  2-PAK) 0.3 mg/0.3 mL IJ SOAJ injection Inject 0.3 mg into the muscle as needed for anaphylaxis. (Patient not taking: Reported on 07/27/2023)   [DISCONTINUED] lisinopril  (ZESTRIL ) 20 MG tablet Take 1 tablet (20 mg total) by mouth daily. (Patient not taking: Reported on 07/27/2023)   [DISCONTINUED] naproxen  (NAPROSYN ) 500 MG tablet Take 1 tablet (500 mg total) by mouth 2 (two) times daily with a meal. (Patient not taking: Reported on 07/27/2023)   [DISCONTINUED] tiZANidine  (ZANAFLEX ) 4 MG tablet Take 1 tablet (4 mg total) by mouth at bedtime. (Patient not taking: Reported on 07/27/2023)   No facility-administered encounter medications on file as of 07/27/2023.    Past  Medical History:  Diagnosis Date   Anemia    Asthma    ASTHMA, EXERCISE INDUCED 08/17/2006   Qualifier: Diagnosis of  By: Sharron Railing     ECZEMA, ATOPIC DERMATITIS 08/17/2006   Qualifier: Diagnosis of  By: Sharron Railing     Enlargement of tonsils    Hypertension     Past Surgical History:  Procedure Laterality Date   LEG SURGERY Right     Family History  Problem Relation Age of Onset   Cancer Mother    COPD Mother     Social History   Socioeconomic History   Marital status: Married    Spouse name: Not on file   Number of children: Not on file   Years of education: Not on file   Highest education level: GED or equivalent  Occupational History   Not on file  Tobacco Use   Smoking status: Former    Current packs/day: 0.00    Types: Cigarettes    Quit date: 06/20/2004    Years since quitting: 19.1   Smokeless tobacco: Never   Tobacco comments:    only smoked for about a year   Vaping Use   Vaping status: Never Used  Substance and Sexual Activity   Alcohol use: No   Drug use: No   Sexual activity: Yes    Birth control/protection: Condom  Other Topics Concern   Not on file  Social History Narrative   Married for 5 years.Lives with wife and daughter.Works at [employer].   Social Drivers of Dispensing Optician  Resource Strain: Medium Risk (07/26/2023)   Overall Financial Resource Strain (CARDIA)    Difficulty of Paying Living Expenses: Somewhat hard  Food Insecurity: Food Insecurity Present (07/26/2023)   Hunger Vital Sign    Worried About Running Out of Food in the Last Year: Sometimes true    Ran Out of Food in the Last Year: Sometimes true  Transportation Needs: No Transportation Needs (07/26/2023)   PRAPARE - Administrator, Civil Service (Medical): No    Lack of Transportation (Non-Medical): No  Physical Activity: Unknown (07/26/2023)   Exercise Vital Sign    Days of Exercise per Week: 0 days    Minutes of Exercise per Session: Not on  file  Stress: No Stress Concern Present (07/26/2023)   [employer] of Occupational Health - Occupational Stress Questionnaire    Feeling of Stress : Not at all  Recent Concern: Stress - Stress Concern Present (05/25/2023)   [employer] of Occupational Health - Occupational Stress Questionnaire    Feeling of Stress : Rather much  Social Connections: Moderately Integrated (07/26/2023)   Social Connection and Isolation Panel [NHANES]    Frequency of Communication with Friends and Family: Twice a week    Frequency of Social Gatherings with Friends and Family: Once a week    Attends Religious Services: 1 to 4 times per year    Active Member of [employer] or Organizations: No    Attends Engineer, Structural: Not on file    Marital Status: Married  Recent Concern: Social Connections - Moderately Isolated (05/25/2023)   Social Connection and Isolation Panel [NHANES]    Frequency of Communication with Friends and Family: Three times a week    Frequency of Social Gatherings with Friends and Family: Once a week    Attends Religious Services: Never    Database Administrator or Organizations: No    Attends Engineer, Structural: Not on file    Marital Status: Married  Catering Manager Violence: Not on file   Review of Systems  Constitutional:  Negative for chills and fever.  HENT:  Negative for sore throat.   Respiratory:  Negative for cough and shortness of breath.   Cardiovascular:  Negative for chest pain, palpitations and leg swelling.  Gastrointestinal:  Positive for diarrhea (Chronic). Negative for abdominal pain, blood in stool, constipation, nausea and vomiting.  Genitourinary:  Negative for dysuria and hematuria.  Musculoskeletal:  Negative for myalgias.  Skin:  Negative for itching and rash.  Neurological:  Negative for dizziness and headaches.  Psychiatric/Behavioral:  Negative for depression and suicidal ideas.     Objective    BP 120/80   Pulse 72   Ht 5'  11 (1.803 m)   Wt (!) 326 lb 12.8 oz (148.2 kg)   SpO2 96%   BMI 45.58 kg/m   Physical Exam Vitals reviewed.  Constitutional:      General: He is not in acute distress.    Appearance: Normal appearance. He is obese. He is not ill-appearing.  HENT:     Head: Normocephalic and atraumatic.     Right Ear: External ear normal.     Left Ear: External ear normal.     Nose: Nose normal. No congestion or rhinorrhea.     Mouth/Throat:     Mouth: Mucous membranes are moist.     Pharynx: Oropharynx is clear.  Eyes:     General: No scleral icterus.    Extraocular Movements: Extraocular movements intact.  Conjunctiva/sclera: Conjunctivae normal.     Pupils: Pupils are equal, round, and reactive to light.  Cardiovascular:     Rate and Rhythm: Normal rate and regular rhythm.     Pulses: Normal pulses.     Heart sounds: Normal heart sounds. No murmur heard. Pulmonary:     Effort: Pulmonary effort is normal.     Breath sounds: Normal breath sounds. No wheezing, rhonchi or rales.  Abdominal:     General: Abdomen is flat. Bowel sounds are normal. There is no distension.     Palpations: Abdomen is soft.     Tenderness: There is no abdominal tenderness.  Musculoskeletal:        General: No swelling or deformity. Normal range of motion.     Cervical back: Normal range of motion.  Skin:    General: Skin is warm and dry.     Capillary Refill: Capillary refill takes less than 2 seconds.  Neurological:     General: No focal deficit present.     Mental Status: He is alert and oriented to person, place, and time.     Motor: No weakness.  Psychiatric:        Mood and Affect: Mood normal.        Behavior: Behavior normal.        Thought Content: Thought content normal.   Last CBC Lab Results  Component Value Date   WBC 9.1 11/19/2021   HGB 13.2 11/19/2021   HCT 39.5 11/19/2021   MCV 75.6 (L) 11/19/2021   MCH 24.6 (L) 01/30/2020   RDW 15.0 11/19/2021   PLT 331.0 11/19/2021   Last  metabolic panel Lab Results  Component Value Date   GLUCOSE 80 11/19/2021   NA 138 11/19/2021   K 4.1 11/19/2021   CL 103 11/19/2021   CO2 27 11/19/2021   BUN 9 11/19/2021   CREATININE 0.77 11/19/2021   GFR 119.01 11/19/2021   CALCIUM 9.3 11/19/2021   PROT 7.5 11/19/2021   ALBUMIN 4.1 11/19/2021   BILITOT 0.4 11/19/2021   ALKPHOS 78 11/19/2021   AST 21 11/19/2021   ALT 20 11/19/2021   ANIONGAP 8 09/03/2018   Last lipids Lab Results  Component Value Date   CHOL 207 (H) 11/19/2021   HDL 38.70 (L) 11/19/2021   LDLCALC 151 (H) 11/19/2021   TRIG 91.0 11/19/2021   CHOLHDL 5 11/19/2021   Last hemoglobin A1c Lab Results  Component Value Date   HGBA1C 5.5 11/19/2021   Last thyroid  functions Lab Results  Component Value Date   TSH 0.60 03/25/2021   T4TOTAL 8.5 01/30/2020   Last vitamin D  Lab Results  Component Value Date   VD25OH 10.32 (L) 11/19/2021   Assessment & Plan:   Problem List Items Addressed This Visit       HYPERTENSION, BENIGN ESSENTIAL - Primary   Patient documented history of essential hypertension.  Not taking any antihypertensive medication currently and his most recent been prescribed lisinopril  20 mg daily.  BP today is 120/80.  No indication to resume medication for now.  Will continue to monitor at subsequent appointments with a low threshold to resume lisinopril .      Asthma   Asymptomatic currently.  Pulmonary exam unremarkable.      Chronic diarrhea   He endorses a history of chronic diarrhea since childhood.  He has multiple watery bowel movements daily.  Nonbloody.  States that he may have a formed stool once every few months.  Denies undergoing workup previously.  Will screen for celiac disease and alpha gal with basic labs today.      Vitamin D  deficiency disease   Noted on previous labs.  Review vitamin D  level ordered today.      Return in about 6 months (around 01/24/2024).   Manus FORBES Fireman, MD

## 2023-07-27 NOTE — Assessment & Plan Note (Signed)
 Asymptomatic currently.  Pulmonary exam unremarkable.

## 2023-07-27 NOTE — Assessment & Plan Note (Signed)
 Patient documented history of essential hypertension.  Not taking any antihypertensive medication currently and his most recent been prescribed lisinopril  20 mg daily.  BP today is 120/80.  No indication to resume medication for now.  Will continue to monitor at subsequent appointments with a low threshold to resume lisinopril .

## 2023-07-27 NOTE — Assessment & Plan Note (Signed)
 Noted on previous labs.  Review vitamin D  level ordered today.

## 2023-07-27 NOTE — Assessment & Plan Note (Signed)
 He endorses a history of chronic diarrhea since childhood.  He has multiple watery bowel movements daily.  Nonbloody.  States that he may have a formed stool once every few months.  Denies undergoing workup previously.  Will screen for celiac disease and alpha gal with basic labs today.

## 2023-07-28 ENCOUNTER — Other Ambulatory Visit: Payer: Self-pay | Admitting: Internal Medicine

## 2023-07-28 ENCOUNTER — Encounter: Payer: Self-pay | Admitting: Internal Medicine

## 2023-07-28 DIAGNOSIS — E559 Vitamin D deficiency, unspecified: Secondary | ICD-10-CM

## 2023-07-28 MED ORDER — VITAMIN D (ERGOCALCIFEROL) 1.25 MG (50000 UNIT) PO CAPS: 50000.0000 [IU] | ORAL_CAPSULE | ORAL | 0 refills | Status: AC

## 2023-07-29 LAB — VITAMIN D 25 HYDROXY (VIT D DEFICIENCY, FRACTURES): Vit D, 25-Hydroxy: 10.7 ng/mL — ABNORMAL LOW (ref 30.0–100.0)

## 2023-07-29 LAB — HIV ANTIBODY (ROUTINE TESTING W REFLEX)

## 2023-07-29 LAB — CBC WITH DIFFERENTIAL/PLATELET
Basophils Absolute: 0.1 10*3/uL (ref 0.0–0.2)
Basos: 1 %
EOS (ABSOLUTE): 0.5 10*3/uL — ABNORMAL HIGH (ref 0.0–0.4)
Eos: 5 %
Hematocrit: 41.9 % (ref 37.5–51.0)
Hemoglobin: 13.7 g/dL (ref 13.0–17.7)
Immature Grans (Abs): 0.1 10*3/uL (ref 0.0–0.1)
Immature Granulocytes: 1 %
Lymphocytes Absolute: 4.2 10*3/uL — ABNORMAL HIGH (ref 0.7–3.1)
Lymphs: 42 %
MCH: 24.7 pg — ABNORMAL LOW (ref 26.6–33.0)
MCHC: 32.7 g/dL (ref 31.5–35.7)
MCV: 76 fL — ABNORMAL LOW (ref 79–97)
Monocytes Absolute: 0.5 10*3/uL (ref 0.1–0.9)
Monocytes: 5 %
Neutrophils Absolute: 4.7 10*3/uL (ref 1.4–7.0)
Neutrophils: 46 %
Platelets: 353 10*3/uL (ref 150–450)
RBC: 5.55 x10E6/uL (ref 4.14–5.80)
RDW: 14.5 % (ref 11.6–15.4)
WBC: 10.1 10*3/uL (ref 3.4–10.8)

## 2023-07-29 LAB — HEMOGLOBIN A1C
Est. average glucose Bld gHb Est-mCnc: 114 mg/dL
Hgb A1c MFr Bld: 5.6 % (ref 4.8–5.6)

## 2023-07-29 LAB — B12 AND FOLATE PANEL
Folate: 4.3 ng/mL (ref >3.0)
Vitamin B-12: 386 pg/mL (ref 232–1245)

## 2023-07-29 LAB — CMP14+EGFR
ALT: 31 [IU]/L (ref 0–44)
AST: 28 [IU]/L (ref 0–40)
Albumin: 4.3 g/dL (ref 4.1–5.1)
Alkaline Phosphatase: 96 [IU]/L (ref 44–121)
BUN/Creatinine Ratio: 10 (ref 9–20)
BUN: 8 mg/dL (ref 6–20)
Bilirubin Total: 0.3 mg/dL (ref 0.0–1.2)
CO2: 24 mmol/L (ref 20–29)
Calcium: 9.3 mg/dL (ref 8.7–10.2)
Chloride: 101 mmol/L (ref 96–106)
Creatinine, Ser: 0.8 mg/dL (ref 0.76–1.27)
Globulin, Total: 2.9 g/dL (ref 1.5–4.5)
Glucose: 86 mg/dL (ref 70–99)
Potassium: 4.4 mmol/L (ref 3.5–5.2)
Sodium: 139 mmol/L (ref 134–144)
Total Protein: 7.2 g/dL (ref 6.0–8.5)
eGFR: 120 mL/min/{1.73_m2} (ref >59)

## 2023-07-29 LAB — LIPID PANEL
Chol/HDL Ratio: 6.2 {ratio} — ABNORMAL HIGH (ref 0.0–5.0)
Cholesterol, Total: 216 mg/dL — ABNORMAL HIGH (ref 100–199)
HDL: 35 mg/dL — ABNORMAL LOW (ref >39)
LDL Chol Calc (NIH): 147 mg/dL — ABNORMAL HIGH (ref 0–99)
Triglycerides: 185 mg/dL — ABNORMAL HIGH (ref 0–149)
VLDL Cholesterol Cal: 34 mg/dL (ref 5–40)

## 2023-07-29 LAB — TSH+FREE T4
Free T4: 1.14 ng/dL (ref 0.82–1.77)
TSH: 1.3 u[IU]/mL (ref 0.450–4.500)

## 2023-07-30 LAB — CELIAC DISEASE AB SCREEN W/RFX
Antigliadin Abs, IgA: 9 U (ref 0–19)
IgA/Immunoglobulin A, Serum: 340 mg/dL (ref 90–386)

## 2024-01-25 ENCOUNTER — Ambulatory Visit: Payer: Medicaid Other | Admitting: Internal Medicine

## 2024-02-05 ENCOUNTER — Other Ambulatory Visit: Payer: Self-pay

## 2024-02-05 ENCOUNTER — Ambulatory Visit: Admission: EM | Admit: 2024-02-05 | Discharge: 2024-02-05 | Disposition: A | Discharge status: Home / Self Care

## 2024-02-05 ENCOUNTER — Encounter (HOSPITAL_COMMUNITY): Payer: Self-pay

## 2024-02-05 ENCOUNTER — Observation Stay (HOSPITAL_COMMUNITY)
Admission: EM | Admit: 2024-02-05 | Discharge: 2024-02-07 | Disposition: A | Discharge status: Home / Self Care | Attending: General Surgery | Admitting: General Surgery

## 2024-02-05 ENCOUNTER — Emergency Department (HOSPITAL_COMMUNITY)

## 2024-02-05 DIAGNOSIS — I10 Essential (primary) hypertension: Secondary | ICD-10-CM | POA: Diagnosis not present

## 2024-02-05 DIAGNOSIS — R1031 Right lower quadrant pain: Secondary | ICD-10-CM | POA: Diagnosis not present

## 2024-02-05 DIAGNOSIS — J45909 Unspecified asthma, uncomplicated: Secondary | ICD-10-CM | POA: Insufficient documentation

## 2024-02-05 DIAGNOSIS — Z87891 Personal history of nicotine dependence: Secondary | ICD-10-CM | POA: Insufficient documentation

## 2024-02-05 DIAGNOSIS — Z91040 Latex allergy status: Secondary | ICD-10-CM | POA: Diagnosis not present

## 2024-02-05 DIAGNOSIS — R000 Tachycardia, unspecified: Secondary | ICD-10-CM | POA: Diagnosis not present

## 2024-02-05 DIAGNOSIS — K3580 Unspecified acute appendicitis: Secondary | ICD-10-CM | POA: Diagnosis not present

## 2024-02-05 DIAGNOSIS — K353 Acute appendicitis with localized peritonitis, without perforation or gangrene: Secondary | ICD-10-CM | POA: Diagnosis not present

## 2024-02-05 DIAGNOSIS — R162 Hepatomegaly with splenomegaly, not elsewhere classified: Secondary | ICD-10-CM | POA: Diagnosis not present

## 2024-02-05 DIAGNOSIS — K760 Fatty (change of) liver, not elsewhere classified: Secondary | ICD-10-CM | POA: Diagnosis not present

## 2024-02-05 LAB — COMPREHENSIVE METABOLIC PANEL WITH GFR
ALT: 28 U/L (ref 0–44)
AST: 25 U/L (ref 15–41)
Albumin: 3.8 g/dL (ref 3.5–5.0)
Alkaline Phosphatase: 89 U/L (ref 38–126)
Anion gap: 9 (ref 5–15)
BUN: 12 mg/dL (ref 6–20)
CO2: 24 mmol/L (ref 22–32)
Calcium: 9.2 mg/dL (ref 8.9–10.3)
Chloride: 104 mmol/L (ref 98–111)
Creatinine, Ser: 0.91 mg/dL (ref 0.61–1.24)
GFR, Estimated: >60 mL/min (ref >60)
Glucose, Bld: 97 mg/dL (ref 70–99)
Potassium: 3.9 mmol/L (ref 3.5–5.1)
Sodium: 137 mmol/L (ref 135–145)
Total Bilirubin: 0.5 mg/dL (ref 0.0–1.2)
Total Protein: 8 g/dL (ref 6.5–8.1)

## 2024-02-05 LAB — URINALYSIS, ROUTINE W REFLEX MICROSCOPIC
Bilirubin Urine: NEGATIVE
Glucose, UA: NEGATIVE mg/dL
Hgb urine dipstick: NEGATIVE
Ketones, ur: NEGATIVE mg/dL
Leukocytes,Ua: NEGATIVE
Nitrite: NEGATIVE
Protein, ur: NEGATIVE mg/dL
Specific Gravity, Urine: 1.017 (ref 1.005–1.030)
pH: 5 (ref 5.0–8.0)

## 2024-02-05 LAB — CBC
HCT: 42 % (ref 39.0–52.0)
Hemoglobin: 13.5 g/dL (ref 13.0–17.0)
MCH: 24.9 pg — ABNORMAL LOW (ref 26.0–34.0)
MCHC: 32.1 g/dL (ref 30.0–36.0)
MCV: 77.5 fL — ABNORMAL LOW (ref 80.0–100.0)
Platelets: 330 K/uL (ref 150–400)
RBC: 5.42 MIL/uL (ref 4.22–5.81)
RDW: 14.8 % (ref 11.5–15.5)
WBC: 17.9 K/uL — ABNORMAL HIGH (ref 4.0–10.5)
nRBC: 0 % (ref 0.0–0.2)

## 2024-02-05 LAB — LIPASE, BLOOD: Lipase: 28 U/L (ref 11–51)

## 2024-02-05 MED ORDER — SODIUM CHLORIDE 0.9 % IV SOLN: Freq: Once | INTRAVENOUS | Status: AC

## 2024-02-05 MED ORDER — SODIUM CHLORIDE 0.9 % IV SOLN
2.0000 g | Freq: Three times a day (TID) | INTRAVENOUS | Status: DC
  Administered 2024-02-05 – 2024-02-07 (×5): 2 g via INTRAVENOUS
  Filled 2024-02-05 (×5): qty 12.5

## 2024-02-05 MED ORDER — METRONIDAZOLE 500 MG/100ML IV SOLN
500.0000 mg | Freq: Two times a day (BID) | INTRAVENOUS | Status: DC
  Administered 2024-02-06 – 2024-02-07 (×2): 500 mg via INTRAVENOUS
  Filled 2024-02-05 (×2): qty 100

## 2024-02-05 MED ORDER — HYDROMORPHONE HCL 1 MG/ML IJ SOLN
1.0000 mg | Freq: Once | INTRAMUSCULAR | Status: AC
  Administered 2024-02-05: 1 mg via INTRAVENOUS
  Filled 2024-02-05: qty 1

## 2024-02-05 MED ORDER — IOHEXOL 300 MG/ML  SOLN
100.0000 mL | Freq: Once | INTRAMUSCULAR | Status: AC | PRN
  Administered 2024-02-05: 100 mL via INTRAVENOUS

## 2024-02-05 MED ORDER — MORPHINE SULFATE (PF) 4 MG/ML IV SOLN
4.0000 mg | Freq: Once | INTRAVENOUS | Status: AC
  Administered 2024-02-05: 4 mg via INTRAVENOUS
  Filled 2024-02-05: qty 1

## 2024-02-05 MED ORDER — ONDANSETRON HCL 4 MG/2ML IJ SOLN
4.0000 mg | Freq: Once | INTRAMUSCULAR | Status: AC
  Administered 2024-02-05: 4 mg via INTRAVENOUS
  Filled 2024-02-05: qty 2

## 2024-02-05 NOTE — ED Provider Notes (Signed)
 [facility]    CSN: [id number] Arrival date & time: 02/05/24  1758      History   Chief Complaint No chief complaint on file.   HPI David [name] is a 33 y.o. male.   Patient presenting today with sudden onset right lower quadrant abdominal pain that started around 930 this morning.  Has been sharp, stabbing and unrelenting at about an 8-10 out of 10 depending on position ever since.  Has a low-grade fever, but no nausea, vomiting, upper respiratory symptoms, new foods or medications, recent dietary changes.  Does have some diarrhea but states that is not abnormal for him as he has IBS-D.  He states he tried to have a bowel movement this morning but it caused shooting pain to the right lower quadrant so he was unable to pass stool.  So far not trying anything over-the-counter for symptoms.    Past Medical History:  Diagnosis Date   Anemia    Asthma    ASTHMA, EXERCISE INDUCED 08/17/2006   Qualifier: Diagnosis of  By: Sharron Railing     ECZEMA, ATOPIC DERMATITIS 08/17/2006   Qualifier: Diagnosis of  By: Sharron Railing     Enlargement of tonsils    Hypertension     Patient Active Problem List   Diagnosis Date Noted   Chronic diarrhea 07/27/2023   Diabetes mellitus screening 11/19/2021   Class 3 severe obesity with serious comorbidity and body mass index (BMI) of 40.0 to 44.9 in adult 11/19/2021   Vitamin D  deficiency disease 11/18/2020   Asthma 02/04/2013   HYPERTENSION, BENIGN ESSENTIAL 02/16/2009   CERVICAL LYMPHADENOPATHY, RIGHT 02/16/2009   SHOULDER PAIN, RIGHT 11/18/2008   PSORIASIS 02/14/2007   Morbid obesity (HCC) 08/17/2006   BACK PAIN, LOW 08/17/2006    Past Surgical History:  Procedure Laterality Date   LEG SURGERY Right        Home Medications    Prior to Admission medications   Medication Sig Start Date End Date Taking? Authorizing Provider  EPINEPHrine  (EPIPEN  2-PAK) 0.3 mg/0.3 mL IJ SOAJ injection Inject 0.3 mg into the muscle as  needed for anaphylaxis. 07/27/23   Melvenia Manus BRAVO, MD    Family History Family History  Problem Relation Age of Onset   Cancer Mother    COPD Mother     Social History Social History   Tobacco Use   Smoking status: Former    Current packs/day: 0.00    Types: Cigarettes    Quit date: 06/20/2004    Years since quitting: 19.6   Smokeless tobacco: Never   Tobacco comments:    only smoked for about a year   Vaping Use   Vaping status: Never Used  Substance Use Topics   Alcohol use: No   Drug use: No     Allergies   Amoxicillin-pot clavulanate, Other, Passion fruit flavoring agent (non-screening), and Latex   Review of Systems Review of Systems Per HPI  Physical Exam Triage Vital Signs ED Triage Vitals  Encounter Vitals Group     BP 02/05/24 1840 124/71     Girls Systolic BP Percentile --      Girls Diastolic BP Percentile --      Boys Systolic BP Percentile --      Boys Diastolic BP Percentile --      Pulse Rate 02/05/24 1840 90     Resp 02/05/24 1840 20     Temp 02/05/24 1840 99.6 F (37.6 C)     Temp Source  02/05/24 1840 Oral     SpO2 02/05/24 1840 96 %     Weight --      Height --      Head Circumference --      Peak Flow --      Pain Score 02/05/24 1846 6     Pain Loc --      Pain Education --      Exclude from Growth Chart --    No data found.  Updated Vital Signs BP 124/71 (BP Location: Right Arm)   Pulse 90   Temp 99.6 F (37.6 C) (Oral)   Resp 20   SpO2 96%   Visual Acuity Right Eye Distance:   Left Eye Distance:   Bilateral Distance:    Right Eye Near:   Left Eye Near:    Bilateral Near:     Physical Exam Vitals and nursing note reviewed.  Constitutional:      General: He is in acute distress.     Comments: Appears in significant pain, clutching right lower quadrant  HENT:     Head: Atraumatic.     Mouth/Throat:     Mouth: Mucous membranes are moist.  Eyes:     Extraocular Movements: Extraocular movements intact.      Conjunctiva/sclera: Conjunctivae normal.  Cardiovascular:     Rate and Rhythm: Normal rate and regular rhythm.  Pulmonary:     Effort: Pulmonary effort is normal.     Breath sounds: Normal breath sounds.  Abdominal:     General: Bowel sounds are normal.     Comments: Severe right lower quadrant tenderness to palpation, positive McBurney's  Musculoskeletal:        General: Normal range of motion.     Cervical back: Normal range of motion and neck supple.  Skin:    General: Skin is warm and dry.  Neurological:     Mental Status: He is oriented to person, place, and time.  Psychiatric:        Mood and Affect: Mood normal.        Thought Content: Thought content normal.        Judgment: Judgment normal.      UC Treatments / Results  Labs (all labs ordered are listed, but only abnormal results are displayed) Labs Reviewed - No data to display  EKG   Radiology No results found.  Procedures Procedures (including critical care time)  Medications Ordered in UC Medications - No data to display  Initial Impression / Assessment and Plan / UC Course  I have reviewed the triage vital signs and the nursing notes.  Pertinent labs & imaging results that were available during my care of the patient were reviewed by me and considered in my medical decision making (see chart for details).     Very concerning for possible appendicitis.  Recommended further evaluation immediately in the emergency department.  Patient agreeable and wishes to go via private vehicle.  Recommended not driving self and to call someone to drive him over to the emergency department.  He is currently hemodynamically stable for transport.  Final Clinical Impressions(s) / UC Diagnoses   Final diagnoses:  RLQ abdominal pain     Discharge Instructions      I am concerned about appendicitis.  Highly recommend going to the emergency department as soon as possible for further evaluation.  We do not recommend  that you drive yourself.    ED Prescriptions   None    PDMP not  reviewed this encounter.

## 2024-02-05 NOTE — ED Provider Notes (Signed)
 [place] EMERGENCY DEPARTMENT AT [facility] Provider Note   CSN: [id number] Arrival date & time: 02/05/24  8086     Patient presents with: Flank Pain   David [name] is a 33 y.o. male with history of IBS-D presents emerged from today for evaluation of right lower quadrant pain starting this morning.  Describes it as a stabbing sensation.  No radiation.  Denies any involvement of his genitals.  Has some occasional nausea but that has been more recent.  Denies any vomiting, diarrhea, constipation, fever, dysuria, hematuria, or any cough or cold symptoms.  He denies any trauma to the area.  Reports he actually had a formed stool this morning.  He reports that his IBS symptoms are usually in his mid abdomen and never been to his right lower quadrant.  He is still passing gas.  Was seen in urgent care and sent over due to location of pain.  Allergic to penicillins.  Denies any tobacco, EtOH illicit drug use ever.   Flank Pain Associated symptoms include abdominal pain. Pertinent negatives include no chest pain and no shortness of breath.       Prior to Admission medications   Medication Sig Start Date End Date Taking? Authorizing Provider  EPINEPHrine  (EPIPEN  2-PAK) 0.3 mg/0.3 mL IJ SOAJ injection Inject 0.3 mg into the muscle as needed for anaphylaxis. 07/27/23   Melvenia Manus BRAVO, MD    Allergies: Amoxicillin-pot clavulanate, Other, Passion fruit flavoring agent (non-screening), and Latex    Review of Systems  Constitutional:  Negative for chills and fever.  HENT:  Negative for congestion and rhinorrhea.   Respiratory:  Negative for cough and shortness of breath.   Cardiovascular:  Negative for chest pain.  Gastrointestinal:  Positive for abdominal pain and nausea. Negative for blood in stool, constipation, diarrhea and vomiting.  Genitourinary:  Negative for dysuria, frequency, hematuria, penile discharge, penile pain, penile swelling, scrotal swelling, testicular pain and  urgency.    Updated Vital Signs BP (!) 140/75   Pulse 87   Temp 98.8 F (37.1 C) (Oral)   Resp 18   SpO2 95%   Physical Exam Vitals and nursing note reviewed.  Constitutional:      General: He is not in acute distress.    Appearance: He is not toxic-appearing.  Eyes:     General: No scleral icterus. Cardiovascular:     Rate and Rhythm: Tachycardia present.  Pulmonary:     Effort: Pulmonary effort is normal. No respiratory distress.  Abdominal:     General: Bowel sounds are normal.     Palpations: Abdomen is soft.     Tenderness: There is abdominal tenderness in the right lower quadrant. There is no guarding or rebound.     Comments: No overlying skin changes noted  Skin:    General: Skin is warm and dry.  Neurological:     Mental Status: He is alert.     (all labs ordered are listed, but only abnormal results are displayed) Labs Reviewed  CBC - Abnormal; Notable for the following components:      Result Value   WBC 17.9 (*)    MCV 77.5 (*)    MCH 24.9 (*)    All other components within normal limits  LIPASE, BLOOD  COMPREHENSIVE METABOLIC PANEL WITH GFR  URINALYSIS, ROUTINE W REFLEX MICROSCOPIC    EKG: None  Radiology: CT ABDOMEN PELVIS W CONTRAST Result Date: 02/05/2024 CLINICAL DATA:  Right lower quadrant and flank pain. EXAM: CT  ABDOMEN AND PELVIS WITH CONTRAST TECHNIQUE: Multidetector CT imaging of the abdomen and pelvis was performed using the standard protocol following bolus administration of intravenous contrast. RADIATION DOSE REDUCTION: This exam was performed according to the departmental dose-optimization program which includes automated exposure control, adjustment of the mA and/or kV according to patient size and/or use of iterative reconstruction technique. CONTRAST:  100mL OMNIPAQUE  IOHEXOL  300 MG/ML  SOLN COMPARISON:  None. FINDINGS: Lower chest: No acute abnormality. Hepatobiliary: The liver is 24 cm length and moderately steatotic without mass  enhancement. The gallbladder and bile ducts are unremarkable. Pancreas: No abnormality. Spleen: Mildly prominent, 14.5 cm in length.  No mass. Adrenals/Urinary Tract: No abnormality. Stomach/Bowel: Unremarkable stomach and unopacified small bowel. The appendix is prominent measuring 10 mm at most distally. There is faint stranding surrounding the distal appendix concerning for early acute appendicitis. There is no evidence of appendiceal rupture or abscess. The large bowel wall is unremarkable. Vascular/Lymphatic: There are multiple shotty mesenteric lymph nodes in the right mid to lower abdomen, largest of these is 9 mm short axis, probably reactive. There is no bulky or encasing adenopathy or further significant lymph nodes. No significant vascular findings. Reproductive: Prostate is unremarkable. Both testicles are in the scrotal sac. Other: No free fluid, free hemorrhage, free air or incarcerated hernia. There is subcutaneous stranding in the anterolateral abdominal wall on both sides which is greater on the right and could be posttraumatic, infectious or from third spacing. There is no overlying skin thickening. Musculoskeletal: No acute or significant osseous findings. A small bone island incidentally is noted in the right hemisacrum. IMPRESSION: 1. Prominent appendix with faint stranding surrounding the distal appendix concerning for early acute appendicitis. No evidence of appendiceal rupture or abscess. 2. Shotty mesenteric lymph nodes in the right mid to lower abdomen, probably reactive. 3. Moderate hepatic steatosis with hepatomegaly.  Mild splenomegaly. 4. Subcutaneous stranding in the anterolateral abdominal wall on both sides, greater on the right, which could be posttraumatic, infectious or from third spacing. 5. Critical Value/emergent results were called by telephone at the time of interpretation on 02/05/2024 at 10:30 pm to provider Jamirra Curnow , who verbally acknowledged these results.  Electronically Signed   By: Francis Quam M.D.   On: 02/05/2024 22:35     Procedures   Medications Ordered in the ED  0.9 %  sodium chloride  infusion (has no administration in time range)  HYDROmorphone  (DILAUDID ) injection 1 mg (has no administration in time range)  morphine  (PF) 4 MG/ML injection 4 mg (4 mg Intravenous Given 02/05/24 1953)  ondansetron  (ZOFRAN ) injection 4 mg (4 mg Intravenous Given 02/05/24 1952)  HYDROmorphone  (DILAUDID ) injection 1 mg (1 mg Intravenous Given 02/05/24 2115)  iohexol  (OMNIPAQUE ) 300 MG/ML solution 100 mL (100 mLs Intravenous Contrast Given 02/05/24 2208)    Medical Decision Making Amount and/or Complexity of Data Reviewed Labs: ordered. Radiology: ordered.  Risk Prescription drug management. Decision regarding hospitalization.   33 y.o. male presents to the ER for evaluation of RLQ abdominal pain. Differential diagnosis includes but is not limited to AAA, mesenteric ischemia, appendicitis, diverticulitis, DKA, gastroenteritis, nephrolithiasis, pancreatitis, constipation, UTI, bowel obstruction, biliary disease, IBD, PUD, hepatitis, testicular torsion. Vital signs mildly elevated blood pressure, mild tachycardia, otherwise unremarkable. Physical exam as noted above.   I have ordered patient some pain and nausea medication.  Labs and CT imaging ordered due to location of pain and presentation.  He denies any radiation to his genitals or any swelling.  Will proceed with CT  imaging.  I independently reviewed and interpreted the patient's labs.  CBC white count at 17.9.  No anemia..  CMP no other electrolyte or LFT abnormality.  Lipase within normal limits.  Urinalysis unremarkable.  Received call from radiologist with concern for early appendicitis.  Reports that there is some 10 mm dilation with some inflammatory surrounding the distal appendix but no fecalith visualized.  Consult placed to general surgery.  CT abdomen pelvis shows1. Prominent appendix  with faint stranding surrounding the distal appendix concerning for early acute appendicitis. No evidence of appendiceal rupture or abscess. 2. Shotty mesenteric lymph nodes in the right mid to lower abdomen, probably reactive. 3. Moderate hepatic steatosis with hepatomegaly.  Mild splenomegaly. 4. Subcutaneous stranding in the anterolateral abdominal wall on both sides, greater on the right, which could be posttraumatic, infectious or from third spacing.  Consulted general surgery and spoke with Dr. Mavis.  Plan is for Zosyn, admit to medicine, n.p.o. after midnight.  Discussed results with patient and plan for admission.  I reevaluated his abdomen.  Still having right lower quadrant pain palpation.  There is still no overlying skin changes noted to the area.  There is no induration or fluctuance.  No increase in warmth or erythema.  Unsure what the #4 reading was on the CT imaging.  Admit to medicine.  Portions of this report may have been transcribed using voice recognition software. Every effort was made to ensure accuracy; however, inadvertent computerized transcription errors may be present.    Final diagnoses:  Acute appendicitis, unspecified acute appendicitis type    ED Discharge Orders     None          Bernis Ernst, DEVONNA 02/05/24 2251    Yolande Lamar BROCKS, MD 02/08/24 2149

## 2024-02-05 NOTE — ED Notes (Signed)
 Patient transported to CT

## 2024-02-05 NOTE — ED Triage Notes (Addendum)
 Pt c/o right sided flank pain that started this morning. Pt endorses nausea. Hx of IBS-D and describes it as a stabbing pain that is constant. Sent from [facility]

## 2024-02-05 NOTE — ED Triage Notes (Signed)
 Pt reports right side abdominal pain since 9:30 this morning. Sharp stabbing pain pt is current guarding the area where the pain is located. Denies nausea and vomiting. Hx of IBS-D. Typically has diarrhea, but has had difficulty producing stool today. Every time he tries to have a BM it causes a shooting pain through the abdomen.

## 2024-02-05 NOTE — ED Notes (Signed)
 Patient is being discharged from the Urgent Care and sent to the Emergency Department via POV. Per Vernell Hammersmith, patient is in need of higher level of care due to abdominal pain. Patient is aware and verbalizes understanding of plan of care.  Vitals:   02/05/24 1840  BP: 124/71  Pulse: 90  Resp: 20  Temp: 99.6 F (37.6 C)  SpO2: 96%

## 2024-02-05 NOTE — Discharge Instructions (Addendum)
 I am concerned about appendicitis.  Highly recommend going to the emergency department as soon as possible for further evaluation.  We do not recommend that you drive yourself.

## 2024-02-06 ENCOUNTER — Encounter (HOSPITAL_COMMUNITY): Payer: Self-pay | Admitting: Family Medicine

## 2024-02-06 ENCOUNTER — Observation Stay (HOSPITAL_BASED_OUTPATIENT_CLINIC_OR_DEPARTMENT_OTHER): Admitting: Certified Registered"

## 2024-02-06 ENCOUNTER — Observation Stay (HOSPITAL_COMMUNITY): Admitting: Certified Registered"

## 2024-02-06 ENCOUNTER — Encounter (HOSPITAL_COMMUNITY)
Admission: EM | Disposition: A | Discharge status: Home / Self Care | Payer: Self-pay | Source: Home / Self Care | Attending: Emergency Medicine

## 2024-02-06 DIAGNOSIS — K3580 Unspecified acute appendicitis: Secondary | ICD-10-CM | POA: Diagnosis not present

## 2024-02-06 DIAGNOSIS — J45909 Unspecified asthma, uncomplicated: Secondary | ICD-10-CM

## 2024-02-06 DIAGNOSIS — Z87891 Personal history of nicotine dependence: Secondary | ICD-10-CM

## 2024-02-06 DIAGNOSIS — I10 Essential (primary) hypertension: Secondary | ICD-10-CM

## 2024-02-06 DIAGNOSIS — F17200 Nicotine dependence, unspecified, uncomplicated: Secondary | ICD-10-CM | POA: Diagnosis not present

## 2024-02-06 DIAGNOSIS — K353 Acute appendicitis with localized peritonitis, without perforation or gangrene: Secondary | ICD-10-CM | POA: Diagnosis not present

## 2024-02-06 HISTORY — PX: XI ROBOTIC LAPAROSCOPIC ASSISTED APPENDECTOMY: SHX6877

## 2024-02-06 LAB — CBC
HCT: 40 % (ref 39.0–52.0)
Hemoglobin: 12.9 g/dL — ABNORMAL LOW (ref 13.0–17.0)
MCH: 25.1 pg — ABNORMAL LOW (ref 26.0–34.0)
MCHC: 32.3 g/dL (ref 30.0–36.0)
MCV: 77.8 fL — ABNORMAL LOW (ref 80.0–100.0)
Platelets: 306 K/uL (ref 150–400)
RBC: 5.14 MIL/uL (ref 4.22–5.81)
RDW: 15.1 % (ref 11.5–15.5)
WBC: 14.3 K/uL — ABNORMAL HIGH (ref 4.0–10.5)
nRBC: 0 % (ref 0.0–0.2)

## 2024-02-06 LAB — BASIC METABOLIC PANEL WITH GFR
Anion gap: 8 (ref 5–15)
BUN: 11 mg/dL (ref 6–20)
CO2: 26 mmol/L (ref 22–32)
Calcium: 8.7 mg/dL — ABNORMAL LOW (ref 8.9–10.3)
Chloride: 103 mmol/L (ref 98–111)
Creatinine, Ser: 0.89 mg/dL (ref 0.61–1.24)
GFR, Estimated: >60 mL/min (ref >60)
Glucose, Bld: 115 mg/dL — ABNORMAL HIGH (ref 70–99)
Potassium: 4 mmol/L (ref 3.5–5.1)
Sodium: 137 mmol/L (ref 135–145)

## 2024-02-06 SURGERY — APPENDECTOMY, ROBOT-ASSISTED, LAPAROSCOPIC
Anesthesia: General | Site: Abdomen

## 2024-02-06 MED ORDER — HYDROMORPHONE HCL 1 MG/ML IJ SOLN: 1.0000 mg | INTRAMUSCULAR | Status: DC | PRN

## 2024-02-06 MED ORDER — METRONIDAZOLE 500 MG/100ML IV SOLN: 500.0000 mg | Freq: Two times a day (BID) | INTRAVENOUS | Status: DC

## 2024-02-06 MED ORDER — ACETAMINOPHEN 650 MG RE SUPP: 650.0000 mg | Freq: Four times a day (QID) | RECTAL | Status: DC | PRN

## 2024-02-06 MED ORDER — KETOROLAC TROMETHAMINE 30 MG/ML IJ SOLN
30.0000 mg | Freq: Four times a day (QID) | INTRAMUSCULAR | Status: AC
  Administered 2024-02-06: 30 mg via INTRAVENOUS
  Filled 2024-02-06: qty 1

## 2024-02-06 MED ORDER — SUCCINYLCHOLINE CHLORIDE 200 MG/10ML IV SOSY
PREFILLED_SYRINGE | INTRAVENOUS | Status: AC
  Filled 2024-02-06: qty 10

## 2024-02-06 MED ORDER — OXYCODONE HCL 5 MG/5ML PO SOLN: 5.0000 mg | Freq: Once | ORAL | Status: DC | PRN

## 2024-02-06 MED ORDER — CHLORHEXIDINE GLUCONATE 0.12 % MT SOLN
OROMUCOSAL | Status: AC
  Administered 2024-02-06: 15 mL via OROMUCOSAL
  Filled 2024-02-06: qty 15

## 2024-02-06 MED ORDER — HYDROMORPHONE HCL 1 MG/ML IJ SOLN
0.2500 mg | INTRAMUSCULAR | Status: DC | PRN
  Administered 2024-02-06: 0.5 mg via INTRAVENOUS
  Filled 2024-02-06: qty 0.5

## 2024-02-06 MED ORDER — STERILE WATER FOR IRRIGATION IR SOLN
Status: DC | PRN
  Administered 2024-02-06: 500 mL

## 2024-02-06 MED ORDER — MIDAZOLAM HCL 5 MG/5ML IJ SOLN
INTRAMUSCULAR | Status: DC | PRN
  Administered 2024-02-06 (×2): 1 mg via INTRAVENOUS

## 2024-02-06 MED ORDER — EPHEDRINE 5 MG/ML INJ
INTRAVENOUS | Status: AC
  Filled 2024-02-06: qty 5

## 2024-02-06 MED ORDER — SUGAMMADEX SODIUM 200 MG/2ML IV SOLN
INTRAVENOUS | Status: DC | PRN
  Administered 2024-02-06 (×2): 200 mg via INTRAVENOUS

## 2024-02-06 MED ORDER — ONDANSETRON HCL 4 MG/2ML IJ SOLN
INTRAMUSCULAR | Status: DC | PRN
  Administered 2024-02-06: 4 mg via INTRAVENOUS

## 2024-02-06 MED ORDER — CHLORHEXIDINE GLUCONATE CLOTH 2 % EX PADS
6.0000 | MEDICATED_PAD | Freq: Once | CUTANEOUS | Status: AC
  Administered 2024-02-06: 6 via TOPICAL

## 2024-02-06 MED ORDER — ACETAMINOPHEN 325 MG PO TABS: 650.0000 mg | ORAL_TABLET | Freq: Four times a day (QID) | ORAL | Status: DC | PRN

## 2024-02-06 MED ORDER — PHENYLEPHRINE 80 MCG/ML (10ML) SYRINGE FOR IV PUSH (FOR BLOOD PRESSURE SUPPORT)
PREFILLED_SYRINGE | INTRAVENOUS | Status: AC
  Filled 2024-02-06: qty 10

## 2024-02-06 MED ORDER — DEXAMETHASONE SODIUM PHOSPHATE 10 MG/ML IJ SOLN
INTRAMUSCULAR | Status: AC
  Filled 2024-02-06: qty 1

## 2024-02-06 MED ORDER — GLYCOPYRROLATE PF 0.2 MG/ML IJ SOSY
PREFILLED_SYRINGE | INTRAMUSCULAR | Status: AC
Start: 2024-02-06 — End: 2024-02-06
  Filled 2024-02-06: qty 1

## 2024-02-06 MED ORDER — FENTANYL CITRATE (PF) 100 MCG/2ML IJ SOLN
INTRAMUSCULAR | Status: DC | PRN
  Administered 2024-02-06 (×4): 50 ug via INTRAVENOUS

## 2024-02-06 MED ORDER — DEXAMETHASONE SODIUM PHOSPHATE 10 MG/ML IJ SOLN
INTRAMUSCULAR | Status: DC | PRN
Start: 2024-02-06 — End: 2024-02-06
  Administered 2024-02-06: 5 mg via INTRAVENOUS

## 2024-02-06 MED ORDER — OXYCODONE HCL 5 MG PO TABS
5.0000 mg | ORAL_TABLET | ORAL | Status: DC | PRN
  Administered 2024-02-06 – 2024-02-07 (×2): 5 mg via ORAL
  Filled 2024-02-06 (×2): qty 1

## 2024-02-06 MED ORDER — LIDOCAINE 2% (20 MG/ML) 5 ML SYRINGE
INTRAMUSCULAR | Status: AC
  Filled 2024-02-06: qty 5

## 2024-02-06 MED ORDER — SUCCINYLCHOLINE CHLORIDE 200 MG/10ML IV SOSY
PREFILLED_SYRINGE | INTRAVENOUS | Status: DC | PRN
  Administered 2024-02-06: 140 mg via INTRAVENOUS

## 2024-02-06 MED ORDER — SUGAMMADEX SODIUM 200 MG/2ML IV SOLN
INTRAVENOUS | Status: AC
  Filled 2024-02-06: qty 2

## 2024-02-06 MED ORDER — FENTANYL CITRATE (PF) 250 MCG/5ML IJ SOLN
INTRAMUSCULAR | Status: AC
  Filled 2024-02-06: qty 5

## 2024-02-06 MED ORDER — ONDANSETRON HCL 4 MG/2ML IJ SOLN: 4.0000 mg | Freq: Once | INTRAMUSCULAR | Status: DC | PRN

## 2024-02-06 MED ORDER — KETOROLAC TROMETHAMINE 30 MG/ML IJ SOLN
INTRAMUSCULAR | Status: AC
  Filled 2024-02-06: qty 1

## 2024-02-06 MED ORDER — BUPIVACAINE HCL (PF) 0.5 % IJ SOLN
INTRAMUSCULAR | Status: DC | PRN
  Administered 2024-02-06: 30 mL

## 2024-02-06 MED ORDER — PROPOFOL 500 MG/50ML IV EMUL
INTRAVENOUS | Status: DC | PRN
  Administered 2024-02-06: 75 ug/kg/min via INTRAVENOUS

## 2024-02-06 MED ORDER — ONDANSETRON HCL 4 MG/2ML IJ SOLN
INTRAMUSCULAR | Status: AC
  Filled 2024-02-06: qty 2

## 2024-02-06 MED ORDER — ROCURONIUM BROMIDE 10 MG/ML (PF) SYRINGE
PREFILLED_SYRINGE | INTRAVENOUS | Status: AC
  Filled 2024-02-06: qty 10

## 2024-02-06 MED ORDER — SIMETHICONE 80 MG PO CHEW: 40.0000 mg | CHEWABLE_TABLET | Freq: Four times a day (QID) | ORAL | Status: DC | PRN

## 2024-02-06 MED ORDER — ENOXAPARIN SODIUM 40 MG/0.4ML IJ SOSY
40.0000 mg | PREFILLED_SYRINGE | INTRAMUSCULAR | Status: DC
  Administered 2024-02-07: 40 mg via SUBCUTANEOUS
  Filled 2024-02-06: qty 0.4

## 2024-02-06 MED ORDER — PROPOFOL 10 MG/ML IV BOLUS
INTRAVENOUS | Status: AC
  Filled 2024-02-06: qty 20

## 2024-02-06 MED ORDER — CHLORHEXIDINE GLUCONATE CLOTH 2 % EX PADS
6.0000 | MEDICATED_PAD | Freq: Once | CUTANEOUS | Status: AC
Start: 2024-02-06 — End: 2024-02-06
  Administered 2024-02-06: 6 via TOPICAL

## 2024-02-06 MED ORDER — OXYCODONE HCL 5 MG PO TABS: 5.0000 mg | ORAL_TABLET | Freq: Once | ORAL | Status: DC | PRN

## 2024-02-06 MED ORDER — CHLORHEXIDINE GLUCONATE 0.12 % MT SOLN: 15.0000 mL | Freq: Once | OROMUCOSAL | Status: AC

## 2024-02-06 MED ORDER — ONDANSETRON HCL 4 MG PO TABS: 4.0000 mg | ORAL_TABLET | Freq: Four times a day (QID) | ORAL | Status: DC | PRN

## 2024-02-06 MED ORDER — PROPOFOL 500 MG/50ML IV EMUL
INTRAVENOUS | Status: AC
  Filled 2024-02-06: qty 50

## 2024-02-06 MED ORDER — ONDANSETRON HCL 4 MG/2ML IJ SOLN
INTRAMUSCULAR | Status: AC
Start: 2024-02-06 — End: 2024-02-06
  Filled 2024-02-06: qty 2

## 2024-02-06 MED ORDER — BUPIVACAINE HCL (PF) 0.5 % IJ SOLN
INTRAMUSCULAR | Status: AC
  Filled 2024-02-06: qty 30

## 2024-02-06 MED ORDER — PROPOFOL 10 MG/ML IV BOLUS
INTRAVENOUS | Status: DC | PRN
  Administered 2024-02-06: 200 mg via INTRAVENOUS

## 2024-02-06 MED ORDER — ROCURONIUM BROMIDE 10 MG/ML (PF) SYRINGE
PREFILLED_SYRINGE | INTRAVENOUS | Status: DC | PRN
  Administered 2024-02-06: 10 mg via INTRAVENOUS
  Administered 2024-02-06: 60 mg via INTRAVENOUS

## 2024-02-06 MED ORDER — ONDANSETRON HCL 4 MG/2ML IJ SOLN
4.0000 mg | Freq: Four times a day (QID) | INTRAMUSCULAR | Status: DC | PRN
Start: 2024-02-06 — End: 2024-02-07

## 2024-02-06 MED ORDER — KETOROLAC TROMETHAMINE 30 MG/ML IJ SOLN: 30.0000 mg | Freq: Four times a day (QID) | INTRAMUSCULAR | Status: DC | PRN

## 2024-02-06 MED ORDER — KETOROLAC TROMETHAMINE 30 MG/ML IJ SOLN
30.0000 mg | Freq: Once | INTRAMUSCULAR | Status: AC
  Administered 2024-02-06: 30 mg via INTRAVENOUS

## 2024-02-06 MED ORDER — HYDROMORPHONE HCL 1 MG/ML IJ SOLN: 0.5000 mg | INTRAMUSCULAR | Status: DC | PRN

## 2024-02-06 MED ORDER — SODIUM CHLORIDE 0.9 % IV SOLN: INTRAVENOUS | Status: AC

## 2024-02-06 MED ORDER — MIDAZOLAM HCL 2 MG/2ML IJ SOLN
INTRAMUSCULAR | Status: AC
  Filled 2024-02-06: qty 2

## 2024-02-06 SURGICAL SUPPLY — 37 items
CHLORAPREP W/TINT 26 (MISCELLANEOUS) ×1 IMPLANT
COVER LIGHT HANDLE (MISCELLANEOUS) IMPLANT
COVER MAYO STAND XLG (MISCELLANEOUS) ×1 IMPLANT
DERMABOND ADVANCED .7 DNX12 (GAUZE/BANDAGES/DRESSINGS) ×1 IMPLANT
DRAPE ARM DVNC X/XI (DISPOSABLE) ×3 IMPLANT
DRAPE COLUMN DVNC XI (DISPOSABLE) ×1 IMPLANT
ELECTRODE REM PT RTRN 9FT ADLT (ELECTROSURGICAL) ×1 IMPLANT
FORCEPS BPLR R/ABLATION 8 DVNC (INSTRUMENTS) ×1 IMPLANT
GAUZE 4X4 16PLY ~~LOC~~+RFID DBL (SPONGE) ×1 IMPLANT
GLOVE BIOGEL PI IND STRL 7.0 (GLOVE) ×2 IMPLANT
GLOVE SURG SS PI 6.5 STRL IVOR (GLOVE) IMPLANT
GLOVE SURG SS PI 7.5 STRL IVOR (GLOVE) ×2 IMPLANT
GOWN STRL REUS W/TWL LRG LVL3 (GOWN DISPOSABLE) ×3 IMPLANT
IRRIGATOR SUCT 8 DISP DVNC XI (IRRIGATION / IRRIGATOR) IMPLANT
KIT PINK PAD W/HEAD ARM REST (MISCELLANEOUS) ×1 IMPLANT
KIT TURNOVER KIT A (KITS) ×1 IMPLANT
MANIFOLD NEPTUNE II (INSTRUMENTS) ×1 IMPLANT
NDL HYPO 21X1.5 SAFETY (NEEDLE) ×1 IMPLANT
NDL INSUFFLATION 14GA 120MM (NEEDLE) ×1 IMPLANT
NEEDLE HYPO 21X1.5 SAFETY (NEEDLE) ×1 IMPLANT
NEEDLE INSUFFLATION 14GA 120MM (NEEDLE) ×1 IMPLANT
NS IRRIG 500ML POUR BTL (IV SOLUTION) ×1 IMPLANT
OBTURATOR OPTICALSTD 8 DVNC (TROCAR) ×1 IMPLANT
PACK LAP CHOLE LZT030E (CUSTOM PROCEDURE TRAY) ×1 IMPLANT
PAD ARMBOARD POSITIONER FOAM (MISCELLANEOUS) ×1 IMPLANT
PENCIL HANDSWITCHING (ELECTRODE) ×1 IMPLANT
POSITIONER HEAD 8X9X4 ADT (SOFTGOODS) ×1 IMPLANT
RELOAD STAPLE 30 2.5 WHT DVNC (STAPLE) ×1 IMPLANT
SEAL UNIV 5-12 XI (MISCELLANEOUS) ×3 IMPLANT
SEALER VESSEL EXT DVNC XI (MISCELLANEOUS) IMPLANT
SET TUBE SMOKE EVAC HIGH FLOW (TUBING) IMPLANT
STAPLER 30 CRVD 8 SUREFORM (STAPLE) ×1 IMPLANT
SUT MNCRL AB 4-0 PS2 18 (SUTURE) ×1 IMPLANT
SYR 30ML LL (SYRINGE) ×1 IMPLANT
SYSTEM BAG RETRIEVAL 10MM (BASKET) ×1 IMPLANT
TAPE TRANSPORE STRL 2 31045 (GAUZE/BANDAGES/DRESSINGS) ×1 IMPLANT
WATER STERILE IRR 500ML POUR (IV SOLUTION) ×1 IMPLANT

## 2024-02-06 NOTE — TOC CM/SW Note (Signed)
 Transition of Care [facility]) - Inpatient Brief Assessment   Patient Details  Name: David [name] MRN: [id number] Date of Birth: [birth year]  Transition of Care [facility]) CM/SW Contact:    Lucie Lunger, LCSWA Phone Number: 02/06/2024, 9:18 AM   Clinical Narrative: Transition of Care Department [facility]) has reviewed patient and no TOC needs have been identified at this time. We will continue to monitor patient advancement through interdiciplinary progression rounds. If new patient transition needs arise, please place a TOC consult.  Transition of Care Asessment: Insurance and Status: Insurance coverage has been reviewed Patient has primary care physician: No (PCP list added to AVS) Home environment has been reviewed: From home Prior level of function:: Independent Prior/Current Home Services: No current home services Social Drivers of Health Review: SDOH reviewed no interventions necessary Readmission risk has been reviewed: Yes Transition of care needs: no transition of care needs at this time

## 2024-02-06 NOTE — Consult Note (Signed)
 Reason for Consult: Right lower quadrant abdominal pain Referring Physician: Dr. Vicci Frederic [name] David [name] is an 33 y.o. male.  HPI: Patient is a 33 year old white male who presented to the emergency room yesterday with worsening right lower quadrant abdominal pain.  It started yesterday morning.  The pain was sharp and localized to the right lower quadrant.  He denies any fever or chills.  He denies any diarrhea or constipation.  A CT scan of the abdomen pelvis revealed early acute appendicitis.  No perforation was seen.  Past Medical History:  Diagnosis Date   Anemia    Asthma    ASTHMA, EXERCISE INDUCED 08/17/2006   Qualifier: Diagnosis of  By: Sharron Railing     ECZEMA, ATOPIC DERMATITIS 08/17/2006   Qualifier: Diagnosis of  By: Sharron Railing     Enlargement of tonsils    Hypertension     Past Surgical History:  Procedure Laterality Date   LEG SURGERY Right     Family History  Problem Relation Age of Onset   Cancer Mother    COPD Mother     Social History:  reports that he quit smoking about 19 years ago. His smoking use included cigarettes. He has never used smokeless tobacco. He reports that he does not drink alcohol and does not use drugs.  Allergies:  Allergies  Allergen Reactions   Amoxicillin-Pot Clavulanate Anaphylaxis    Pt reports that he stops breathing   Other Anaphylaxis   Passion Fruit Flavoring Agent (Non-Screening) Anaphylaxis   Latex Swelling    Medications: I have reviewed the patient's current medications. Prior to Admission: (Not in a hospital admission)   Results for orders placed or performed during the hospital encounter of 02/05/24 (from the past 48 hours)  Lipase, blood     Status: None   Collection Time: 02/05/24  7:51 PM  Result Value Ref Range   Lipase 28 11 - 51 U/L    Comment: Performed at [facility], [address], [place], [place] [address]  Comprehensive metabolic panel     Status: None   Collection Time: 02/05/24  7:51 PM   Result Value Ref Range   Sodium 137 135 - 145 mmol/L   Potassium 3.9 3.5 - 5.1 mmol/L   Chloride 104 98 - 111 mmol/L   CO2 24 22 - 32 mmol/L   Glucose, Bld 97 70 - 99 mg/dL    Comment: Glucose reference range applies only to samples taken after fasting for at least 8 hours.   BUN 12 6 - 20 mg/dL   Creatinine, Ser 9.08 0.61 - 1.24 mg/dL   Calcium 9.2 8.9 - 89.6 mg/dL   Total Protein 8.0 6.5 - 8.1 g/dL   Albumin 3.8 3.5 - 5.0 g/dL   AST 25 15 - 41 U/L   ALT 28 0 - 44 U/L   Alkaline Phosphatase 89 38 - 126 U/L   Total Bilirubin 0.5 0.0 - 1.2 mg/dL   GFR, Estimated >39 >39 mL/min    Comment: (NOTE) Calculated using the CKD-EPI Creatinine Equation (2021)    Anion gap 9 5 - 15    Comment: Performed at [facility], [address], [place], [place] [address]  CBC     Status: Abnormal   Collection Time: 02/05/24  7:51 PM  Result Value Ref Range   WBC 17.9 (H) 4.0 - 10.5 K/uL   RBC 5.42 4.22 - 5.81 MIL/uL   Hemoglobin 13.5 13.0 - 17.0 g/dL   HCT 57.9 60.9 -  52.0 %   MCV 77.5 (L) 80.0 - 100.0 fL   MCH 24.9 (L) 26.0 - 34.0 pg   MCHC 32.1 30.0 - 36.0 g/dL   RDW 85.1 88.4 - 84.4 %   Platelets 330 150 - 400 K/uL   nRBC 0.0 0.0 - 0.2 %    Comment: Performed at [facility], [address], [place], [place] [address]  Urinalysis, Routine w reflex microscopic -Urine, Clean Catch     Status: None   Collection Time: 02/05/24  8:22 PM  Result Value Ref Range   Color, Urine YELLOW YELLOW   APPearance CLEAR CLEAR   Specific Gravity, Urine 1.017 1.005 - 1.030   pH 5.0 5.0 - 8.0   Glucose, UA NEGATIVE NEGATIVE mg/dL   Hgb urine dipstick NEGATIVE NEGATIVE   Bilirubin Urine NEGATIVE NEGATIVE   Ketones, ur NEGATIVE NEGATIVE mg/dL   Protein, ur NEGATIVE NEGATIVE mg/dL   Nitrite NEGATIVE NEGATIVE   Leukocytes,Ua NEGATIVE NEGATIVE    Comment: Performed at [facility], [address] [place], [place] [address]  Basic metabolic panel     Status: Abnormal   Collection Time: 02/06/24  3:24 AM   Result Value Ref Range   Sodium 137 135 - 145 mmol/L   Potassium 4.0 3.5 - 5.1 mmol/L   Chloride 103 98 - 111 mmol/L   CO2 26 22 - 32 mmol/L   Glucose, Bld 115 (H) 70 - 99 mg/dL    Comment: Glucose reference range applies only to samples taken after fasting for at least 8 hours.   BUN 11 6 - 20 mg/dL   Creatinine, Ser 9.10 0.61 - 1.24 mg/dL   Calcium 8.7 (L) 8.9 - 10.3 mg/dL   GFR, Estimated >39 >39 mL/min    Comment: (NOTE) Calculated using the CKD-EPI Creatinine Equation (2021)    Anion gap 8 5 - 15    Comment: Performed at [facility], [address] [place], [place] [address]  CBC     Status: Abnormal   Collection Time: 02/06/24  3:24 AM  Result Value Ref Range   WBC 14.3 (H) 4.0 - 10.5 K/uL   RBC 5.14 4.22 - 5.81 MIL/uL   Hemoglobin 12.9 (L) 13.0 - 17.0 g/dL   HCT 59.9 60.9 - 47.9 %   MCV 77.8 (L) 80.0 - 100.0 fL   MCH 25.1 (L) 26.0 - 34.0 pg   MCHC 32.3 30.0 - 36.0 g/dL   RDW 84.8 88.4 - 84.4 %   Platelets 306 150 - 400 K/uL   nRBC 0.0 0.0 - 0.2 %    Comment: Performed at [facility], [address], [place], [place] [address]    CT ABDOMEN PELVIS W CONTRAST Result Date: 02/05/2024 CLINICAL DATA:  Right lower quadrant and flank pain. EXAM: CT ABDOMEN AND PELVIS WITH CONTRAST TECHNIQUE: Multidetector CT imaging of the abdomen and pelvis was performed using the standard protocol following bolus administration of intravenous contrast. RADIATION DOSE REDUCTION: This exam was performed according to the departmental dose-optimization program which includes automated exposure control, adjustment of the mA and/or kV according to patient size and/or use of iterative reconstruction technique. CONTRAST:  OMNIPAQUE  IOHEXOL  300 MG/ML  SOLN COMPARISON:  None. FINDINGS: Lower chest: No acute abnormality. Hepatobiliary: The liver is 24 cm length and moderately steatotic without mass enhancement. The gallbladder and bile ducts are unremarkable. Pancreas: No abnormality. Spleen: Mildly  prominent, 14.5 cm in length.  No mass. Adrenals/Urinary Tract: No abnormality. Stomach/Bowel: Unremarkable stomach and unopacified small bowel.  The appendix is prominent measuring 10 mm at most distally. There is faint stranding surrounding the distal appendix concerning for early acute appendicitis. There is no evidence of appendiceal rupture or abscess. The large bowel wall is unremarkable. Vascular/Lymphatic: There are multiple shotty mesenteric lymph nodes in the right mid to lower abdomen, largest of these is 9 mm short axis, probably reactive. There is no bulky or encasing adenopathy or further significant lymph nodes. No significant vascular findings. Reproductive: Prostate is unremarkable. Both testicles are in the scrotal sac. Other: No free fluid, free hemorrhage, free air or incarcerated hernia. There is subcutaneous stranding in the anterolateral abdominal wall on both sides which is greater on the right and could be posttraumatic, infectious or from third spacing. There is no overlying skin thickening. Musculoskeletal: No acute or significant osseous findings. A small bone island incidentally is noted in the right hemisacrum. IMPRESSION: 1. Prominent appendix with faint stranding surrounding the distal appendix concerning for early acute appendicitis. No evidence of appendiceal rupture or abscess. 2. Shotty mesenteric lymph nodes in the right mid to lower abdomen, probably reactive. 3. Moderate hepatic steatosis with hepatomegaly.  Mild splenomegaly. 4. Subcutaneous stranding in the anterolateral abdominal wall on both sides, greater on the right, which could be posttraumatic, infectious or from third spacing. 5. Critical Value/emergent results were called by telephone at the time of interpretation on 02/05/2024 at 10:30 pm to provider RILEY RANSOM , who verbally acknowledged these results. Electronically Signed   By: Francis Quam M.D.   On: 02/05/2024 22:35    ROS:  Pertinent items are noted in  HPI.  Blood pressure (!) 153/90, pulse 73, temperature 97.6 F (36.4 C), temperature source Oral, resp. rate 18, height 5' 11 (1.803 m), weight (!) 148 kg, SpO2 99%. Physical Exam: Morbidly obese white male in mild to moderate discomfort from his abdominal pain Head is normocephalic, atraumatic Lungs clear to auscultation with equal breath sounds bilaterally Heart examination reveals regular rate and rhythm without S3, S4, murmurs Abdomen is soft with tenderness noted in the right lower quadrant to deep palpation.  No abdominal wall cellulitis or trauma could be found.  CT scan images personally reviewed  Assessment/Plan: Impression: Acute appendicitis Plan: Patient will go to the operating room today for robotic assisted laparoscopic appendectomy.  The risks and benefits of the procedure including bleeding, infection, and the possibility of an open procedure were fully explained to the patient, who gave informed consent.  David [name] 02/06/2024, 8:03 AM

## 2024-02-06 NOTE — Plan of Care (Signed)

## 2024-02-06 NOTE — Anesthesia Postprocedure Evaluation (Signed)
 Anesthesia Post Note  Patient: David [name]  Procedure(s) Performed: APPENDECTOMY, ROBOT-ASSISTED, LAPAROSCOPIC (Abdomen)  Patient location during evaluation: Phase II Anesthesia Type: General Level of consciousness: awake Pain management: pain level controlled Vital Signs Assessment: post-procedure vital signs reviewed and stable Respiratory status: spontaneous breathing and respiratory function stable Cardiovascular status: blood pressure returned to baseline and stable Postop Assessment: no headache and no apparent nausea or vomiting Anesthetic complications: no Comments: Late entry   No notable events documented.   Last Vitals:  Vitals:   02/06/24 1544 02/06/24 2127  BP: (!) 151/98 131/78  Pulse: 89 95  Resp:  16  Temp: 36.7 C 36.6 C  SpO2: 96% 95%    Last Pain:  Vitals:   02/06/24 2127  TempSrc: Oral  PainSc: 6                  [name]

## 2024-02-06 NOTE — H&P (Signed)
 History and Physical    David [name] FMW:[id number] DOB: Jun 08, 1991 DOA: 02/05/2024  PCP: Pcp, No   Patient coming from: Home   Chief Complaint: RLQ pain   HPI: David [name] is a 33 y.o. male with medical history significant for IBS-D who presents with severe right lower quadrant abdominal pain.  Patient was in his usual state and having an uneventful day this morning until he developed pain in his right lower abdominal quadrant which has become severe.  Pain is sharp and stabbing in character, 10/10 in severity, and localized.  He is unable to identify alleviating or exacerbating factors.  He denies any associated nausea, vomiting, or diarrhea.  He is not noted any subjective fever or chills.  He has not experienced this type of pain previously.  ED Course: Upon arrival to the ED, patient is found to be afebrile and saturating well on room air with transient tachycardia and stable blood pressure.  Labs are notable for normal CMP and lipase, and WBC 17,900.  Surgery (Dr. Mavis) was consulted by the ED PA and the patient was treated with Zofran , IV fluids, morphine , Dilaudid , cefepime , and Flagyl .  Review of Systems:  All other systems reviewed and apart from HPI, are negative.  Past Medical History:  Diagnosis Date   Anemia    Asthma    ASTHMA, EXERCISE INDUCED 08/17/2006   Qualifier: Diagnosis of  By: Sharron Railing     ECZEMA, ATOPIC DERMATITIS 08/17/2006   Qualifier: Diagnosis of  By: Sharron Railing     Enlargement of tonsils    Hypertension     Past Surgical History:  Procedure Laterality Date   LEG SURGERY Right     Social History:   reports that he quit smoking about 19 years ago. His smoking use included cigarettes. He has never used smokeless tobacco. He reports that he does not drink alcohol and does not use drugs.  Allergies  Allergen Reactions   Amoxicillin-Pot Clavulanate Anaphylaxis    Pt reports that he stops breathing   Other Anaphylaxis    Passion Fruit Flavoring Agent (Non-Screening) Anaphylaxis   Latex Swelling    Family History  Problem Relation Age of Onset   Cancer Mother    COPD Mother      Prior to Admission medications   Medication Sig Start Date End Date Taking? Authorizing Provider  EPINEPHrine  (EPIPEN  2-PAK) 0.3 mg/0.3 mL IJ SOAJ injection Inject 0.3 mg into the muscle as needed for anaphylaxis. 07/27/23   Melvenia Manus BRAVO, MD    Physical Exam: Vitals:   02/05/24 2100 02/05/24 2200 02/05/24 2300 02/06/24 0000  BP: 138/84 (!) 140/75 138/87 (!) 142/92  Pulse: 89 87 95 84  Resp: 18 17 15 17   Temp:   98 F (36.7 C)   TempSrc:   Oral   SpO2: 95% 95% 94% 96%  Weight:   (!) 148 kg   Height:   5' 11 (1.803 m)     Constitutional: NAD, no pallor or diaphoresis  Eyes: PERTLA, lids and conjunctivae normal ENMT: Mucous membranes are moist. Posterior pharynx clear of any exudate or lesions.   Neck: supple, no masses  Respiratory: no wheezing, no crackles. No accessory muscle use.  Cardiovascular: S1 & S2 heard, regular rate and rhythm. No extremity edema.  Abdomen: Soft, tender in RLQ. Bowel sounds active.  Musculoskeletal: no clubbing / cyanosis. No joint deformity upper and lower extremities.   Skin: no significant rashes, lesions, ulcers. Warm, dry, well-perfused. Neurologic: CN 2-12  grossly intact. Moving all extremities. Alert and oriented.  Psychiatric: Calm. Cooperative.    Labs and Imaging on Admission: I have personally reviewed following labs and imaging studies  CBC: Recent Labs  Lab 02/05/24 1951  WBC 17.9*  HGB 13.5  HCT 42.0  MCV 77.5*  PLT 330   Basic Metabolic Panel: Recent Labs  Lab 02/05/24 1951  NA 137  K 3.9  CL 104  CO2 24  GLUCOSE 97  BUN 12  CREATININE 0.91  CALCIUM 9.2   GFR: Estimated Creatinine Clearance: 168.9 mL/min (by C-G formula based on SCr of 0.91 mg/dL). Liver Function Tests: Recent Labs  Lab 02/05/24 1951  AST 25  ALT 28  ALKPHOS 89  BILITOT 0.5   PROT 8.0  ALBUMIN 3.8   Recent Labs  Lab 02/05/24 1951  LIPASE 28   No results for input(s): AMMONIA in the last 168 hours. Coagulation Profile: No results for input(s): INR, PROTIME in the last 168 hours. Cardiac Enzymes: No results for input(s): CKTOTAL, CKMB, CKMBINDEX, TROPONINI in the last 168 hours. BNP (last 3 results) No results for input(s): PROBNP in the last 8760 hours. HbA1C: No results for input(s): HGBA1C in the last 72 hours. CBG: No results for input(s): GLUCAP in the last 168 hours. Lipid Profile: No results for input(s): CHOL, HDL, LDLCALC, TRIG, CHOLHDL, LDLDIRECT in the last 72 hours. Thyroid  Function Tests: No results for input(s): TSH, T4TOTAL, FREET4, T3FREE, THYROIDAB in the last 72 hours. Anemia Panel: No results for input(s): VITAMINB12, FOLATE, FERRITIN, TIBC, IRON, RETICCTPCT in the last 72 hours. Urine analysis:    Component Value Date/Time   COLORURINE YELLOW 02/05/2024 2022   APPEARANCEUR CLEAR 02/05/2024 2022   LABSPEC 1.017 02/05/2024 2022   PHURINE 5.0 02/05/2024 2022   GLUCOSEU NEGATIVE 02/05/2024 2022   HGBUR NEGATIVE 02/05/2024 2022   BILIRUBINUR NEGATIVE 02/05/2024 2022   KETONESUR NEGATIVE 02/05/2024 2022   PROTEINUR NEGATIVE 02/05/2024 2022   NITRITE NEGATIVE 02/05/2024 2022   LEUKOCYTESUR NEGATIVE 02/05/2024 2022   Sepsis Labs: @LABRCNTIP (procalcitonin:4,lacticidven:4) )No results found for this or any previous visit (from the past 240 hours).   Radiological Exams on Admission: CT ABDOMEN PELVIS W CONTRAST Result Date: 02/05/2024 CLINICAL DATA:  Right lower quadrant and flank pain. EXAM: CT ABDOMEN AND PELVIS WITH CONTRAST TECHNIQUE: Multidetector CT imaging of the abdomen and pelvis was performed using the standard protocol following bolus administration of intravenous contrast. RADIATION DOSE REDUCTION: This exam was performed according to the departmental dose-optimization  program which includes automated exposure control, adjustment of the mA and/or kV according to patient size and/or use of iterative reconstruction technique. CONTRAST:  OMNIPAQUE  IOHEXOL  300 MG/ML  SOLN COMPARISON:  None. FINDINGS: Lower chest: No acute abnormality. Hepatobiliary: The liver is 24 cm length and moderately steatotic without mass enhancement. The gallbladder and bile ducts are unremarkable. Pancreas: No abnormality. Spleen: Mildly prominent, 14.5 cm in length.  No mass. Adrenals/Urinary Tract: No abnormality. Stomach/Bowel: Unremarkable stomach and unopacified small bowel. The appendix is prominent measuring 10 mm at most distally. There is faint stranding surrounding the distal appendix concerning for early acute appendicitis. There is no evidence of appendiceal rupture or abscess. The large bowel wall is unremarkable. Vascular/Lymphatic: There are multiple shotty mesenteric lymph nodes in the right mid to lower abdomen, largest of these is 9 mm short axis, probably reactive. There is no bulky or encasing adenopathy or further significant lymph nodes. No significant vascular findings. Reproductive: Prostate is unremarkable. Both testicles are in the scrotal  sac. Other: No free fluid, free hemorrhage, free air or incarcerated hernia. There is subcutaneous stranding in the anterolateral abdominal wall on both sides which is greater on the right and could be posttraumatic, infectious or from third spacing. There is no overlying skin thickening. Musculoskeletal: No acute or significant osseous findings. A small bone island incidentally is noted in the right hemisacrum. IMPRESSION: 1. Prominent appendix with faint stranding surrounding the distal appendix concerning for early acute appendicitis. No evidence of appendiceal rupture or abscess. 2. Shotty mesenteric lymph nodes in the right mid to lower abdomen, probably reactive. 3. Moderate hepatic steatosis with hepatomegaly.  Mild splenomegaly. 4.  Subcutaneous stranding in the anterolateral abdominal wall on both sides, greater on the right, which could be posttraumatic, infectious or from third spacing. 5. Critical Value/emergent results were called by telephone at the time of interpretation on 02/05/2024 at 10:30 pm to provider RILEY RANSOM , who verbally acknowledged these results. Electronically Signed   By: Francis Quam M.D.   On: 02/05/2024 22:35    Assessment/Plan  1. Acute appendicitis  - Continue empiric antibiotics, pain-control, IVF hydration, and bowel-rest, and follow-up on surgeon's recommendations    DVT prophylaxis: SCDs  Code Status: Full  Level of Care: Level of care: Med-Surg Family Communication: none present  Disposition Plan:  Patient is from: Home  Anticipated d/c is to: Home  Anticipated d/c date is: Possibly as early as 8/19 or 02/07/24 Patient currently: pending surgical consultation  Consults called: Surgery  Admission status: Observation     Evalene GORMAN Sprinkles, MD [facility]  02/06/2024, 1:18 AM

## 2024-02-06 NOTE — Discharge Instructions (Signed)

## 2024-02-06 NOTE — H&P (View-Only) (Signed)
 Reason for Consult: Right lower quadrant abdominal pain Referring Physician: Dr. Vicci Frederic David [name] is an 33 y.o. male.  HPI: Patient is a 33 year old white male who presented to the emergency room yesterday with worsening right lower quadrant abdominal pain.  It started yesterday morning.  The pain was sharp and localized to the right lower quadrant.  He denies any fever or chills.  He denies any diarrhea or constipation.  A CT scan of the abdomen pelvis revealed early acute appendicitis.  No perforation was seen.  Past Medical History:  Diagnosis Date   Anemia    Asthma    ASTHMA, EXERCISE INDUCED 08/17/2006   Qualifier: Diagnosis of  By: Sharron Railing     ECZEMA, ATOPIC DERMATITIS 08/17/2006   Qualifier: Diagnosis of  By: Sharron Railing     Enlargement of tonsils    Hypertension     Past Surgical History:  Procedure Laterality Date   LEG SURGERY Right     Family History  Problem Relation Age of Onset   Cancer Mother    COPD Mother     Social History:  reports that he quit smoking about 19 years ago. His smoking use included cigarettes. He has never used smokeless tobacco. He reports that he does not drink alcohol and does not use drugs.  Allergies:  Allergies  Allergen Reactions   Amoxicillin-Pot Clavulanate Anaphylaxis    Pt reports that he stops breathing   Other Anaphylaxis   Passion Fruit Flavoring Agent (Non-Screening) Anaphylaxis   Latex Swelling    Medications: I have reviewed the patient's current medications. Prior to Admission: (Not in a hospital admission)   Results for orders placed or performed during the hospital encounter of 02/05/24 (from the past 48 hours)  Lipase, blood     Status: None   Collection Time: 02/05/24  7:51 PM  Result Value Ref Range   Lipase 28 11 - 51 U/L    Comment: Performed at [facility], [address], [place], [place] [address]  Comprehensive metabolic panel     Status: None   Collection Time: 02/05/24  7:51 PM   Result Value Ref Range   Sodium 137 135 - 145 mmol/L   Potassium 3.9 3.5 - 5.1 mmol/L   Chloride 104 98 - 111 mmol/L   CO2 24 22 - 32 mmol/L   Glucose, Bld 97 70 - 99 mg/dL    Comment: Glucose reference range applies only to samples taken after fasting for at least 8 hours.   BUN 12 6 - 20 mg/dL   Creatinine, Ser 9.08 0.61 - 1.24 mg/dL   Calcium 9.2 8.9 - 89.6 mg/dL   Total Protein 8.0 6.5 - 8.1 g/dL   Albumin 3.8 3.5 - 5.0 g/dL   AST 25 15 - 41 U/L   ALT 28 0 - 44 U/L   Alkaline Phosphatase 89 38 - 126 U/L   Total Bilirubin 0.5 0.0 - 1.2 mg/dL   GFR, Estimated >39 >39 mL/min    Comment: (NOTE) Calculated using the CKD-EPI Creatinine Equation (2021)    Anion gap 9 5 - 15    Comment: Performed at [facility], [address], [place], [place] [address]  CBC     Status: Abnormal   Collection Time: 02/05/24  7:51 PM  Result Value Ref Range   WBC 17.9 (H) 4.0 - 10.5 K/uL   RBC 5.42 4.22 - 5.81 MIL/uL   Hemoglobin 13.5 13.0 - 17.0 g/dL   HCT 57.9 60.9 -  52.0 %   MCV 77.5 (L) 80.0 - 100.0 fL   MCH 24.9 (L) 26.0 - 34.0 pg   MCHC 32.1 30.0 - 36.0 g/dL   RDW 85.1 88.4 - 84.4 %   Platelets 330 150 - 400 K/uL   nRBC 0.0 0.0 - 0.2 %    Comment: Performed at [facility], [address], [place], [place] [address]  Urinalysis, Routine w reflex microscopic -Urine, Clean Catch     Status: None   Collection Time: 02/05/24  8:22 PM  Result Value Ref Range   Color, Urine YELLOW YELLOW   APPearance CLEAR CLEAR   Specific Gravity, Urine 1.017 1.005 - 1.030   pH 5.0 5.0 - 8.0   Glucose, UA NEGATIVE NEGATIVE mg/dL   Hgb urine dipstick NEGATIVE NEGATIVE   Bilirubin Urine NEGATIVE NEGATIVE   Ketones, ur NEGATIVE NEGATIVE mg/dL   Protein, ur NEGATIVE NEGATIVE mg/dL   Nitrite NEGATIVE NEGATIVE   Leukocytes,Ua NEGATIVE NEGATIVE    Comment: Performed at [facility], [address] [place], [place] [address]  Basic metabolic panel     Status: Abnormal   Collection Time: 02/06/24  3:24 AM   Result Value Ref Range   Sodium 137 135 - 145 mmol/L   Potassium 4.0 3.5 - 5.1 mmol/L   Chloride 103 98 - 111 mmol/L   CO2 26 22 - 32 mmol/L   Glucose, Bld 115 (H) 70 - 99 mg/dL    Comment: Glucose reference range applies only to samples taken after fasting for at least 8 hours.   BUN 11 6 - 20 mg/dL   Creatinine, Ser 9.10 0.61 - 1.24 mg/dL   Calcium 8.7 (L) 8.9 - 10.3 mg/dL   GFR, Estimated >39 >39 mL/min    Comment: (NOTE) Calculated using the CKD-EPI Creatinine Equation (2021)    Anion gap 8 5 - 15    Comment: Performed at [facility], [address] [place], [place] [address]  CBC     Status: Abnormal   Collection Time: 02/06/24  3:24 AM  Result Value Ref Range   WBC 14.3 (H) 4.0 - 10.5 K/uL   RBC 5.14 4.22 - 5.81 MIL/uL   Hemoglobin 12.9 (L) 13.0 - 17.0 g/dL   HCT 59.9 60.9 - 47.9 %   MCV 77.8 (L) 80.0 - 100.0 fL   MCH 25.1 (L) 26.0 - 34.0 pg   MCHC 32.3 30.0 - 36.0 g/dL   RDW 84.8 88.4 - 84.4 %   Platelets 306 150 - 400 K/uL   nRBC 0.0 0.0 - 0.2 %    Comment: Performed at [facility], [address], [place], [place] [address]    CT ABDOMEN PELVIS W CONTRAST Result Date: 02/05/2024 CLINICAL DATA:  Right lower quadrant and flank pain. EXAM: CT ABDOMEN AND PELVIS WITH CONTRAST TECHNIQUE: Multidetector CT imaging of the abdomen and pelvis was performed using the standard protocol following bolus administration of intravenous contrast. RADIATION DOSE REDUCTION: This exam was performed according to the departmental dose-optimization program which includes automated exposure control, adjustment of the mA and/or kV according to patient size and/or use of iterative reconstruction technique. CONTRAST:  OMNIPAQUE  IOHEXOL  300 MG/ML  SOLN COMPARISON:  None. FINDINGS: Lower chest: No acute abnormality. Hepatobiliary: The liver is 24 cm length and moderately steatotic without mass enhancement. The gallbladder and bile ducts are unremarkable. Pancreas: No abnormality. Spleen: Mildly  prominent, 14.5 cm in length.  No mass. Adrenals/Urinary Tract: No abnormality. Stomach/Bowel: Unremarkable stomach and unopacified small bowel.  The appendix is prominent measuring 10 mm at most distally. There is faint stranding surrounding the distal appendix concerning for early acute appendicitis. There is no evidence of appendiceal rupture or abscess. The large bowel wall is unremarkable. Vascular/Lymphatic: There are multiple shotty mesenteric lymph nodes in the right mid to lower abdomen, largest of these is 9 mm short axis, probably reactive. There is no bulky or encasing adenopathy or further significant lymph nodes. No significant vascular findings. Reproductive: Prostate is unremarkable. Both testicles are in the scrotal sac. Other: No free fluid, free hemorrhage, free air or incarcerated hernia. There is subcutaneous stranding in the anterolateral abdominal wall on both sides which is greater on the right and could be posttraumatic, infectious or from third spacing. There is no overlying skin thickening. Musculoskeletal: No acute or significant osseous findings. A small bone island incidentally is noted in the right hemisacrum. IMPRESSION: 1. Prominent appendix with faint stranding surrounding the distal appendix concerning for early acute appendicitis. No evidence of appendiceal rupture or abscess. 2. Shotty mesenteric lymph nodes in the right mid to lower abdomen, probably reactive. 3. Moderate hepatic steatosis with hepatomegaly.  Mild splenomegaly. 4. Subcutaneous stranding in the anterolateral abdominal wall on both sides, greater on the right, which could be posttraumatic, infectious or from third spacing. 5. Critical Value/emergent results were called by telephone at the time of interpretation on 02/05/2024 at 10:30 pm to provider RILEY RANSOM , who verbally acknowledged these results. Electronically Signed   By: Francis Quam M.D.   On: 02/05/2024 22:35    ROS:  Pertinent items are noted in  HPI.  Blood pressure (!) 153/90, pulse 73, temperature 97.6 F (36.4 C), temperature source Oral, resp. rate 18, height 5' 11 (1.803 m), weight (!) 148 kg, SpO2 99%. Physical Exam: Morbidly obese white male in mild to moderate discomfort from his abdominal pain Head is normocephalic, atraumatic Lungs clear to auscultation with equal breath sounds bilaterally Heart examination reveals regular rate and rhythm without S3, S4, murmurs Abdomen is soft with tenderness noted in the right lower quadrant to deep palpation.  No abdominal wall cellulitis or trauma could be found.  CT scan images personally reviewed  Assessment/Plan: Impression: Acute appendicitis Plan: Patient will go to the operating room today for robotic assisted laparoscopic appendectomy.  The risks and benefits of the procedure including bleeding, infection, and the possibility of an open procedure were fully explained to the patient, who gave informed consent.  David [name] 02/06/2024, 8:03 AM

## 2024-02-06 NOTE — Anesthesia Preprocedure Evaluation (Addendum)
 Anesthesia Evaluation  Patient identified by MRN, date of birth, ID band Patient awake    Reviewed: Allergy & Precautions, H&P , NPO status , Patient's Chart, lab work & pertinent test results, reviewed documented beta blocker date and time   Airway Mallampati: II  TM Distance: >3 FB Neck ROM: full    Dental no notable dental hx.    Pulmonary asthma , former smoker   Pulmonary exam normal breath sounds clear to auscultation       Cardiovascular Exercise Tolerance: Good hypertension,  Rhythm:regular Rate:Normal     Neuro/Psych  Neuromuscular disease  negative psych ROS   GI/Hepatic negative GI ROS, Neg liver ROS,,,  Endo/Other    Class 3 obesity  Renal/GU negative Renal ROS  negative genitourinary   Musculoskeletal   Abdominal   Peds  Hematology  (+) Blood dyscrasia, anemia   Anesthesia Other Findings   Reproductive/Obstetrics negative OB ROS                              Anesthesia Physical Anesthesia Plan  ASA: 3 and emergent  Anesthesia Plan: General and General ETT   Post-op Pain Management:    Induction:   PONV Risk Score and Plan: Ondansetron   Airway Management Planned:   Additional Equipment:   Intra-op Plan:   Post-operative Plan:   Informed Consent: I have reviewed the patients History and Physical, chart, labs and discussed the procedure including the risks, benefits and alternatives for the proposed anesthesia with the patient or authorized representative who has indicated his/her understanding and acceptance.     Dental Advisory Given  Plan Discussed with: CRNA  Anesthesia Plan Comments:          Anesthesia Quick Evaluation

## 2024-02-06 NOTE — Interval H&P Note (Signed)
 History and Physical Interval Note:  02/06/2024 11:55 AM  David [name]  has presented today for surgery, with the diagnosis of acute appendicitis.  The various methods of treatment have been discussed with the patient and family. After consideration of risks, benefits and other options for treatment, the patient has consented to  Procedure(s): APPENDECTOMY, ROBOT-ASSISTED, LAPAROSCOPIC (N/A) as a surgical intervention.  The patient's history has been reviewed, patient examined, no change in status, stable for surgery.  I have reviewed the patient's chart and labs.  Questions were answered to the patient's satisfaction.     [name]

## 2024-02-06 NOTE — Transfer of Care (Signed)
 Immediate Anesthesia Transfer of Care Note  Patient: David [name]  Procedure(s) Performed: APPENDECTOMY, ROBOT-ASSISTED, LAPAROSCOPIC (Abdomen)  Patient Location: PACU  Anesthesia Type:General  Level of Consciousness: awake, drowsy, and patient cooperative  Airway & Oxygen Therapy: Patient Spontanous Breathing and Patient connected to face mask oxygen  Post-op Assessment: Report given to RN and Post -op Vital signs reviewed and stable  Post vital signs: Reviewed and stable  Last Vitals:  Vitals Value Taken Time  BP 127/59 02/06/24 14:19  Temp 36.5 C 02/06/24 14:15  Pulse 88 02/06/24 14:20  Resp 18 02/06/24 14:20  SpO2 99 % 02/06/24 14:20  Vitals shown include unfiled device data.  Last Pain:  Vitals:   02/06/24 1142  TempSrc: Oral  PainSc: 5       Patients Stated Pain Goal: 6 (02/06/24 1142)  Complications: No notable events documented.

## 2024-02-06 NOTE — Anesthesia Procedure Notes (Signed)
 Procedure Name: Intubation Date/Time: 02/06/2024 1:09 PM  Performed by: Rhodia Debby MATSU, CRNAPre-anesthesia Checklist: Patient identified, Emergency Drugs available, Suction available, Patient being monitored and Timeout performed Patient Re-evaluated:Patient Re-evaluated prior to induction Oxygen Delivery Method: Circle system utilized Preoxygenation: Pre-oxygenation with 100% oxygen Induction Type: IV induction Ventilation: Mask ventilation without difficulty and Oral airway inserted - appropriate to patient size Laryngoscope Size: Cleotilde and 2 Grade View: Grade I Tube type: Oral Tube size: 7.5 mm Number of attempts: 1 Airway Equipment and Method: Stylet Placement Confirmation: ETT inserted through vocal cords under direct vision, positive ETCO2 and breath sounds checked- equal and bilateral Secured at: 23 cm Tube secured with: Tape Dental Injury: Teeth and Oropharynx as per pre-operative assessment  Comments: Smooth brief atraumatic dentition unchanged vss

## 2024-02-06 NOTE — Op Note (Signed)
 Patient:  David [name]  DOB:  14-Aug-1990  MRN:  [id number]   Preop Diagnosis: Acute appendicitis  Postop Diagnosis: Same  Procedure: Robotic assisted laparoscopic appendectomy  Surgeon: [name], MD  Anes: General Endotracheal  Indications: Patient is a 33 year old white male who presents with right lower quadrant abdominal pain.  CT scan of the abdomen reveals acute appendicitis.  The risks and benefits of the procedure including bleeding, infection, hepatobiliary injury, the possibility of an open procedure were fully explained to the patient, who gave informed consent.  Procedure note: The patient was placed in the supine position.  After induction of general endotracheal anesthesia, the abdomen was prepped and draped using the usual sterile technique with ChloraPrep.  Surgical site confirmation was performed.  An incision was made in the left upper quadrant at Palmer's point.  A Veress needle was introduced into the abdominal cavity and confirmation of placement was done using the saline drop test.  The abdomen was then insufflated to 15 mmHg pressure.  An 8 mm trocar was introduced into the abdominal cavity under direct visualization without difficulty.  Additional 8 mm trocars were placed in the left flank and left lower quadrant regions.  The robot was then docked and targeted.  The appendix was noted to be inflamed and elongated.  There was no evidence of perforation.  The mesoappendix was divided down to its base using the vessel sealer.  A white load Endo GIA was placed across the base of the appendix and fired.  The appendix was then removed using an Endo Catch bag through the left upper quadrant trocar site without difficulty.  The staple line was inspected and noted to be intact.  No bleeding was noted.  The robot was undocked.  All fluid and air were then evacuated from the abdominal cavity prior to the removal of the trocars.  All wounds were irrigated with normal saline.   All wounds were injected with 0.5% Sensorcaine .  All incisions were closed using a 4-0 Monocryl subcuticular suture.  Dermabond was applied.  All tape and needle counts were correct at the end of the procedure.  The patient was extubated in the operating room and transferred to PACU in stable condition.  Complications: None  EBL: Minimal  Specimen: Appendix

## 2024-02-07 ENCOUNTER — Encounter (HOSPITAL_COMMUNITY): Payer: Self-pay | Admitting: General Surgery

## 2024-02-07 LAB — BASIC METABOLIC PANEL WITH GFR
Anion gap: 11 (ref 5–15)
BUN: 13 mg/dL (ref 6–20)
CO2: 24 mmol/L (ref 22–32)
Calcium: 8.5 mg/dL — ABNORMAL LOW (ref 8.9–10.3)
Chloride: 101 mmol/L (ref 98–111)
Creatinine, Ser: 0.75 mg/dL (ref 0.61–1.24)
GFR, Estimated: >60 mL/min (ref >60)
Glucose, Bld: 117 mg/dL — ABNORMAL HIGH (ref 70–99)
Potassium: 4.1 mmol/L (ref 3.5–5.1)
Sodium: 136 mmol/L (ref 135–145)

## 2024-02-07 LAB — CBC
HCT: 38.5 % — ABNORMAL LOW (ref 39.0–52.0)
Hemoglobin: 12.3 g/dL — ABNORMAL LOW (ref 13.0–17.0)
MCH: 24.8 pg — ABNORMAL LOW (ref 26.0–34.0)
MCHC: 31.9 g/dL (ref 30.0–36.0)
MCV: 77.6 fL — ABNORMAL LOW (ref 80.0–100.0)
Platelets: 311 K/uL (ref 150–400)
RBC: 4.96 MIL/uL (ref 4.22–5.81)
RDW: 14.7 % (ref 11.5–15.5)
WBC: 13.4 K/uL — ABNORMAL HIGH (ref 4.0–10.5)
nRBC: 0 % (ref 0.0–0.2)

## 2024-02-07 MED ORDER — OXYCODONE HCL 5 MG PO TABS: 5.0000 mg | ORAL_TABLET | ORAL | 0 refills | Status: AC | PRN

## 2024-02-07 MED ORDER — ENOXAPARIN SODIUM 80 MG/0.8ML IJ SOSY: 75.0000 mg | PREFILLED_SYRINGE | INTRAMUSCULAR | Status: DC

## 2024-02-07 NOTE — Discharge Summary (Signed)
 Physician Discharge Summary  Patient ID: David [name] MRN: [id number] DOB/AGE: [birth year] 34 y.o.  Admit date: 02/05/2024 Discharge date: 02/07/2024  Admission Diagnoses: Acute appendicitis  Discharge Diagnoses: Same Principal Problem:   Acute appendicitis   Discharged Condition: good  Hospital Course: Patient is a 33 year old white male who presented to the emergency room on 02/05/2024 with worsening right lower quadrant abdominal pain.  CT scan of the abdomen revealed early acute appendicitis.  The patient was taken to the operating room on 02/06/2024 and underwent a robotic assisted laparoscopic appendectomy.  He tolerated the surgery well.  His postoperative course has been unremarkable.  His diet was advanced without difficulty.  Patient is being discharged home on 02/07/2024 in good and improving condition.  Treatments: surgery: Robotic assisted laparoscopic appendectomy on 02/06/2024  Discharge Exam: Blood pressure (!) 123/97, pulse 95, temperature 97.9 F (36.6 C), temperature source Oral, resp. rate 18, height 5' 11 (1.803 m), weight (!) 150.1 kg, SpO2 96%. General appearance: alert, cooperative, and no distress Resp: clear to auscultation bilaterally Cardio: regular rate and rhythm, S1, S2 normal, no murmur, click, rub or gallop GI: Soft, incisions healing well.  Disposition: Discharge disposition: 01-Home or Self Care       Discharge Instructions     Diet - low sodium heart healthy   Complete by: As directed    Increase activity slowly   Complete by: As directed       Allergies as of 02/07/2024       Reactions   Amoxicillin-pot Clavulanate Anaphylaxis   Pt reports that he stops breathing   Passion Fruit Flavoring Agent (non-screening) Anaphylaxis   Latex Swelling        Medication List     TAKE these medications    EPINEPHrine  0.3 mg/0.3 mL Soaj injection Commonly known as: EpiPen  2-Pak Inject 0.3 mg into the muscle as needed for anaphylaxis.    oxyCODONE  5 MG immediate release tablet Commonly known as: Roxicodone  Take 1 tablet (5 mg total) by mouth every 4 (four) hours as needed.        Follow-up Information     Mavis Anes, MD Follow up.   Specialty: General Surgery Why: As needed.  Will call you next week for follow up. Contact information: [address] [place] [address] [phone]                 Signed: Anes Mavis 02/07/2024, 8:44 AM

## 2024-02-07 NOTE — Plan of Care (Signed)

## 2024-02-08 LAB — SURGICAL PATHOLOGY

## 2024-02-09 ENCOUNTER — Telehealth: Payer: Self-pay | Admitting: *Deleted

## 2024-02-09 NOTE — Telephone Encounter (Signed)
 Surgical Date: 02/06/2024 Procedure: XI Robotic Assisted Laparoscopic Appendectomy CPT: 44970  DX: K35.80   Received call from patient [phone]~ telephone  Patient reports that Dr. Mavis advised he may return to work on 02/13/2024 on light duty. States that he may return to regular duty on 02/20/2024 with no restrictions.   Patient states that he works at [employer]. States that he was advised that there is no light duty options available unless there is a work related injury.   Requested leave to be for 2 weeks until he can return with no restrictions.   Reports that he will bring forms by office for completion. Requested that forms be completed by 02/13/2024 for patient to pick up.

## 2024-02-12 NOTE — Telephone Encounter (Signed)
 [employer] Disability and leave paperwork completed and faxed to [employer] at [phone]. Confirmation received.  Out of work starting 02/05/2024 and may return unrestricted on 02/20/2024.

## 2024-02-15 ENCOUNTER — Ambulatory Visit (INDEPENDENT_AMBULATORY_CARE_PROVIDER_SITE_OTHER): Admitting: General Surgery

## 2024-02-15 ENCOUNTER — Encounter: Payer: Self-pay | Admitting: General Surgery

## 2024-02-15 DIAGNOSIS — Z09 Encounter for follow-up examination after completed treatment for conditions other than malignant neoplasm: Secondary | ICD-10-CM

## 2024-02-15 DIAGNOSIS — K353 Acute appendicitis with localized peritonitis, without perforation or gangrene: Secondary | ICD-10-CM

## 2024-02-15 NOTE — Progress Notes (Signed)
 Subjective:     David [name]  Virtual postoperative telephone visit performed with patient.  I was in the office.  Patient states he is doing well.  He has minimal incisional pain.  He denies any fevers. Objective:    There were no vitals taken for this visit.  General:  alert, cooperative, and no distress  Final pathology consistent with diagnosis.     Assessment:    Doing well postoperatively.    Plan:   May resume normal activity.  Follow-up here as needed.  As this is a part of the global surgical fee, this was not a billable visit.  Total telephone time was 1 minute.

## 2024-03-08 ENCOUNTER — Encounter (HOSPITAL_COMMUNITY): Payer: Self-pay

## 2024-03-08 ENCOUNTER — Emergency Department (HOSPITAL_COMMUNITY)
Admission: EM | Admit: 2024-03-08 | Discharge: 2024-03-08 | Disposition: A | Discharge status: Home / Self Care | Attending: Emergency Medicine | Admitting: Emergency Medicine

## 2024-03-08 ENCOUNTER — Other Ambulatory Visit: Payer: Self-pay

## 2024-03-08 ENCOUNTER — Emergency Department (HOSPITAL_COMMUNITY)

## 2024-03-08 DIAGNOSIS — Z91040 Latex allergy status: Secondary | ICD-10-CM | POA: Diagnosis not present

## 2024-03-08 DIAGNOSIS — M542 Cervicalgia: Secondary | ICD-10-CM | POA: Diagnosis not present

## 2024-03-08 DIAGNOSIS — M436 Torticollis: Secondary | ICD-10-CM | POA: Insufficient documentation

## 2024-03-08 MED ORDER — HYDROCODONE-ACETAMINOPHEN 5-325 MG PO TABS: 1.0000 | ORAL_TABLET | ORAL | 0 refills | Status: AC | PRN

## 2024-03-08 MED ORDER — METHOCARBAMOL 500 MG PO TABS: 1000.0000 mg | ORAL_TABLET | Freq: Three times a day (TID) | ORAL | 0 refills | Status: AC

## 2024-03-08 NOTE — ED Triage Notes (Signed)
 Pt stated that when he woke up this morning, he had neck pain. Pt has limited ROM to the left, up and down.

## 2024-03-08 NOTE — ED Notes (Signed)
 Pt/family received d/c paperwork at this time. After going over the paperwork any questions, comments, or concerns were answered to the best of this nurse's knowledge. The pt/family verbally acknowledged the teachings/instructions.

## 2024-03-08 NOTE — Discharge Instructions (Addendum)
 Follow up with your doctor if your pain is no better in 3-4 days. Take medications as prescribed. Warm compresses as instructed. REturn to the ED as needed for worsening symptoms or new concerns

## 2024-03-08 NOTE — ED Provider Notes (Signed)
 [place] EMERGENCY DEPARTMENT AT [facility] Provider Note   CSN: [id number] Arrival date & time: 03/08/24  1651     Patient presents with: Neck Pain   David [name] is a 33 y.o. male.   Patient to ED with acute onset left sided neck pain this morning. He feels that when he woke this morning he turned his head and felt a pop with persistent left lateral neck pain throughout today that made turning his head difficult. No trouble swallowing. No fever. No cervical pain.  The history is provided by the patient. No language interpreter was used.  Neck Pain      Prior to Admission medications   Medication Sig Start Date End Date Taking? Authorizing Provider  HYDROcodone -acetaminophen  (NORCO/VICODIN) 5-325 MG tablet Take 1 tablet by mouth every 4 (four) hours as needed. 03/08/24  Yes Darrion Wyszynski, Margit, PA-C  methocarbamol  (ROBAXIN ) 500 MG tablet Take 2 tablets (1,000 mg total) by mouth 3 (three) times daily. 03/08/24  Yes Yale Golla, Margit, PA-C  EPINEPHrine  (EPIPEN  2-PAK) 0.3 mg/0.3 mL IJ SOAJ injection Inject 0.3 mg into the muscle as needed for anaphylaxis. 07/27/23   Melvenia Manus BRAVO, MD  oxyCODONE  (ROXICODONE ) 5 MG immediate release tablet Take 1 tablet (5 mg total) by mouth every 4 (four) hours as needed. 02/07/24 02/06/25  Mavis Anes, MD    Allergies: Amoxicillin-pot clavulanate, Passion fruit flavoring agent (non-screening), and Latex    Review of Systems  Musculoskeletal:  Positive for neck pain.    Updated Vital Signs BP 129/78 (BP Location: Right Arm)   Pulse 95   Temp 98.4 F (36.9 C) (Oral)   Resp 18   Ht 5' 10 (1.778 m)   Wt (!) 140.6 kg   SpO2 97%   BMI 44.48 kg/m   Physical Exam Vitals and nursing note reviewed.  Constitutional:      Appearance: He is well-developed.  Neck:     Comments: Tenderness to left lateral neck with palpable muscular spasm c/w torticollis. No midline cervical tenderness.  Pulmonary:     Effort: Pulmonary effort is normal.   Musculoskeletal:        General: Normal range of motion.     Cervical back: Normal range of motion.  Skin:    General: Skin is warm and dry.  Neurological:     Mental Status: He is alert and oriented to person, place, and time.     (all labs ordered are listed, but only abnormal results are displayed) Labs Reviewed - No data to display  EKG: None  Radiology: DG Cervical Spine 2 or 3 views Result Date: 03/08/2024 CLINICAL DATA:  Patient woke up this morning with neck pain and limited range of motion. EXAM: CERVICAL SPINE - 2-3 VIEW COMPARISON:  None Available. FINDINGS: Vertebral body alignment and heights are normal. Prevertebral soft tissues are normal. Disc space heights are maintained. Atlantoaxial articulation is normal. Very minimal uncovertebral joint spurring is present. IMPRESSION: 1. No acute findings. 2. Minimal uncovertebral joint spurring. Electronically Signed   By: Toribio Agreste M.D.   On: 03/08/2024 17:27     Procedures   Medications Ordered in the ED - No data to display  Clinical Course as of 03/08/24 2119  [facility] Mar 08, 2024  2107 Lateral neck pain with exam c/w torticollis. No concern for cervical radiculopathy. Recommend warm compresses, ibuprofen , Robaxin  and will provide #10 Norco. PCP follow up as needed.  [SU]    Clinical Course User Index [SU] Odell Margit, PA-C  Medical Decision Making Amount and/or Complexity of Data Reviewed Radiology: ordered.        Final diagnoses:  Torticollis, acute    ED Discharge Orders          Ordered    methocarbamol  (ROBAXIN ) 500 MG tablet  3 times daily        03/08/24 2110    HYDROcodone -acetaminophen  (NORCO/VICODIN) 5-325 MG tablet  Every 4 hours PRN        03/08/24 2110               Odell Balls, PA-C 03/08/24 2119    Towana Ozell BROCKS, MD 03/09/24 [phone]

## 2024-07-11 ENCOUNTER — Other Ambulatory Visit: Payer: Self-pay

## 2024-07-11 ENCOUNTER — Emergency Department (HOSPITAL_COMMUNITY)

## 2024-07-11 ENCOUNTER — Encounter (HOSPITAL_COMMUNITY): Payer: Self-pay

## 2024-07-11 ENCOUNTER — Encounter: Payer: Self-pay | Admitting: Family Medicine

## 2024-07-11 ENCOUNTER — Emergency Department (HOSPITAL_COMMUNITY): Admission: EM | Admit: 2024-07-11 | Discharge: 2024-07-11 | Disposition: A | Discharge status: Home / Self Care

## 2024-07-11 DIAGNOSIS — Y92410 Unspecified street and highway as the place of occurrence of the external cause: Secondary | ICD-10-CM | POA: Insufficient documentation

## 2024-07-11 DIAGNOSIS — Z91040 Latex allergy status: Secondary | ICD-10-CM | POA: Diagnosis not present

## 2024-07-11 DIAGNOSIS — R0789 Other chest pain: Secondary | ICD-10-CM | POA: Insufficient documentation

## 2024-07-11 DIAGNOSIS — Z87891 Personal history of nicotine dependence: Secondary | ICD-10-CM | POA: Insufficient documentation

## 2024-07-11 LAB — COMPREHENSIVE METABOLIC PANEL WITH GFR
ALT: 26 U/L (ref 0–44)
AST: 34 U/L (ref 15–41)
Albumin: 4.2 g/dL (ref 3.5–5.0)
Alkaline Phosphatase: 122 U/L (ref 38–126)
Anion gap: 17 — ABNORMAL HIGH (ref 5–15)
BUN: 12 mg/dL (ref 6–20)
CO2: 22 mmol/L (ref 22–32)
Calcium: 9.6 mg/dL (ref 8.9–10.3)
Chloride: 102 mmol/L (ref 98–111)
Creatinine, Ser: 0.77 mg/dL (ref 0.61–1.24)
GFR, Estimated: >60 mL/min
Glucose, Bld: 105 mg/dL — ABNORMAL HIGH (ref 70–99)
Potassium: 4 mmol/L (ref 3.5–5.1)
Sodium: 140 mmol/L (ref 135–145)
Total Bilirubin: 0.2 mg/dL (ref 0.0–1.2)
Total Protein: 7.8 g/dL (ref 6.5–8.1)

## 2024-07-11 LAB — CBC WITH DIFFERENTIAL/PLATELET
Abs Immature Granulocytes: 0.06 K/uL (ref 0.00–0.07)
Basophils Absolute: 0.1 K/uL (ref 0.0–0.1)
Basophils Relative: 1 %
Eosinophils Absolute: 0.4 K/uL (ref 0.0–0.5)
Eosinophils Relative: 3 %
HCT: 41 % (ref 39.0–52.0)
Hemoglobin: 13.1 g/dL (ref 13.0–17.0)
Immature Granulocytes: 1 %
Lymphocytes Relative: 25 %
Lymphs Abs: 3.3 K/uL (ref 0.7–4.0)
MCH: 23.9 pg — ABNORMAL LOW (ref 26.0–34.0)
MCHC: 32 g/dL (ref 30.0–36.0)
MCV: 74.7 fL — ABNORMAL LOW (ref 80.0–100.0)
Monocytes Absolute: 0.8 K/uL (ref 0.1–1.0)
Monocytes Relative: 6 %
Neutro Abs: 8.7 K/uL — ABNORMAL HIGH (ref 1.7–7.7)
Neutrophils Relative %: 64 %
Platelets: 365 K/uL (ref 150–400)
RBC: 5.49 MIL/uL (ref 4.22–5.81)
RDW: 15.2 % (ref 11.5–15.5)
WBC: 13.3 K/uL — ABNORMAL HIGH (ref 4.0–10.5)
nRBC: 0 % (ref 0.0–0.2)

## 2024-07-11 MED ORDER — KETOROLAC TROMETHAMINE 15 MG/ML IJ SOLN
15.0000 mg | Freq: Once | INTRAMUSCULAR | Status: AC
  Administered 2024-07-11: 15 mg via INTRAMUSCULAR
  Filled 2024-07-11: qty 1

## 2024-07-11 NOTE — ED Notes (Signed)
 Patient transported to CT

## 2024-07-11 NOTE — Discharge Instructions (Addendum)
 The CT of your chest is as follows: Lungs/Pleura: Faint ground-glass streaky density in the right apex, likely atelectasis/scarring. A 1 cm right apical subpleural nodule (20/4) likely scarring but warrants follow-up. No pleural effusion or pneumothorax. The central airways are patent.   For pain, you can take 1000 mg of Tylenol  or 1 g of Tylenol  every 6-8 hours.  Do not exceed more than 4000 mg or 4 g in a 24-hour period.  You can also take ibuprofen  600 to 800 mg every 6-8 hours as well.  Do not take this high-dose ibuprofen  for greater than a week.   If you have any shortness of breath or chest pain that is worsening in nature then please come back to the ED for further evaluation.      This small nodule does need to be followed up with primary care physician.  Please follow-up with them for repeat imaging in 3 months.  There is no evidence of any kind of fracture.  No pneumothorax that we discussed.  You will likely be sore going forward.  For pain, you can take 1000 mg of Tylenol  or 1 g of Tylenol  every 6-8 hours.  Do not exceed more than 4000 mg or 4 g in a 24-hour period.  You can also take ibuprofen  600 to 800 mg every 6-8 hours as well.  Do not take this high-dose ibuprofen  for greater than a week.  Having a worsening shortness of breath or any, fever or chills, worsening chest pain, or any kind of abdominal pain to please come back to ED for further evaluation
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
## 2024-07-11 NOTE — ED Notes (Signed)
 "  C-collar applied  "

## 2024-07-11 NOTE — ED Notes (Signed)
 Lemly Md notified of pt arrival.

## 2024-07-30 ENCOUNTER — Ambulatory Visit

## 2024-09-26 ENCOUNTER — Ambulatory Visit
